# Patient Record
Sex: Female | Born: 1940 | Race: White | Hispanic: No | Marital: Married | Smoking: Never smoker
Health system: Southern US, Community
[De-identification: ages and names within clinical notes are randomized; demographics above are authoritative.]

## PROBLEM LIST (undated history)

## (undated) DIAGNOSIS — F419 Anxiety disorder, unspecified: Secondary | ICD-10-CM

## (undated) DIAGNOSIS — F32A Depression, unspecified: Secondary | ICD-10-CM

## (undated) DIAGNOSIS — E328 Other diseases of thymus: Secondary | ICD-10-CM

## (undated) DIAGNOSIS — F329 Major depressive disorder, single episode, unspecified: Secondary | ICD-10-CM

## (undated) DIAGNOSIS — F39 Unspecified mood [affective] disorder: Secondary | ICD-10-CM

## (undated) DIAGNOSIS — M8580 Other specified disorders of bone density and structure, unspecified site: Secondary | ICD-10-CM

## (undated) DIAGNOSIS — M1990 Unspecified osteoarthritis, unspecified site: Secondary | ICD-10-CM

## (undated) HISTORY — DX: Major depressive disorder, single episode, unspecified: F32.9

## (undated) HISTORY — DX: Other specified disorders of bone density and structure, unspecified site: M85.80

## (undated) HISTORY — DX: Unspecified mood (affective) disorder: F39

## (undated) HISTORY — PX: GANGLION CYST EXCISION: SHX1691

## (undated) HISTORY — DX: Anxiety disorder, unspecified: F41.9

## (undated) HISTORY — DX: Depression, unspecified: F32.A

## (undated) HISTORY — DX: Other diseases of thymus: E32.8

## (undated) HISTORY — PX: TOTAL ABDOMINAL HYSTERECTOMY: SHX209

## (undated) HISTORY — DX: Unspecified osteoarthritis, unspecified site: M19.90

---

## 1999-10-17 ENCOUNTER — Other Ambulatory Visit: Admission: RE | Admit: 1999-10-17 | Discharge: 1999-10-17 | Payer: Self-pay | Admitting: *Deleted

## 2000-10-23 ENCOUNTER — Other Ambulatory Visit: Admission: RE | Admit: 2000-10-23 | Discharge: 2000-10-23 | Payer: Self-pay | Admitting: *Deleted

## 2000-11-01 ENCOUNTER — Ambulatory Visit (HOSPITAL_COMMUNITY): Admission: RE | Admit: 2000-11-01 | Discharge: 2000-11-01 | Payer: Self-pay | Admitting: Internal Medicine

## 2001-11-18 ENCOUNTER — Other Ambulatory Visit: Admission: RE | Admit: 2001-11-18 | Discharge: 2001-11-18 | Payer: Self-pay | Admitting: Obstetrics and Gynecology

## 2001-12-16 ENCOUNTER — Encounter: Admission: RE | Admit: 2001-12-16 | Discharge: 2002-03-16 | Payer: Self-pay | Admitting: Internal Medicine

## 2003-03-08 ENCOUNTER — Other Ambulatory Visit: Admission: RE | Admit: 2003-03-08 | Discharge: 2003-03-08 | Payer: Self-pay | Admitting: Obstetrics and Gynecology

## 2008-10-22 ENCOUNTER — Ambulatory Visit (HOSPITAL_COMMUNITY): Admission: RE | Admit: 2008-10-22 | Discharge: 2008-10-22 | Payer: Self-pay | Admitting: Internal Medicine

## 2009-02-02 ENCOUNTER — Encounter: Admission: RE | Admit: 2009-02-02 | Discharge: 2009-02-02 | Payer: Self-pay | Admitting: *Deleted

## 2009-02-22 ENCOUNTER — Ambulatory Visit: Payer: Self-pay | Admitting: Thoracic Surgery

## 2009-03-07 ENCOUNTER — Ambulatory Visit (HOSPITAL_COMMUNITY): Admission: RE | Admit: 2009-03-07 | Discharge: 2009-03-07 | Payer: Self-pay | Admitting: Thoracic Surgery

## 2009-03-07 ENCOUNTER — Ambulatory Visit: Payer: Self-pay | Admitting: Thoracic Surgery

## 2009-06-14 ENCOUNTER — Ambulatory Visit: Payer: Self-pay | Admitting: Thoracic Surgery

## 2009-06-20 ENCOUNTER — Encounter: Admission: RE | Admit: 2009-06-20 | Discharge: 2009-06-20 | Payer: Self-pay | Admitting: Thoracic Surgery

## 2009-07-04 ENCOUNTER — Inpatient Hospital Stay (HOSPITAL_COMMUNITY): Admission: RE | Admit: 2009-07-04 | Discharge: 2009-07-17 | Payer: Self-pay | Admitting: Thoracic Surgery

## 2009-07-04 ENCOUNTER — Encounter: Payer: Self-pay | Admitting: Thoracic Surgery

## 2009-07-04 ENCOUNTER — Ambulatory Visit: Payer: Self-pay | Admitting: Cardiology

## 2009-07-04 HISTORY — PX: OTHER SURGICAL HISTORY: SHX169

## 2009-07-07 ENCOUNTER — Ambulatory Visit: Payer: Self-pay | Admitting: Thoracic Surgery

## 2009-07-07 ENCOUNTER — Ambulatory Visit: Payer: Self-pay | Admitting: Pulmonary Disease

## 2009-07-08 ENCOUNTER — Encounter: Payer: Self-pay | Admitting: Thoracic Surgery

## 2009-07-20 ENCOUNTER — Encounter: Admission: RE | Admit: 2009-07-20 | Discharge: 2009-07-20 | Payer: Self-pay | Admitting: Thoracic Surgery

## 2009-07-20 ENCOUNTER — Ambulatory Visit: Payer: Self-pay | Admitting: Thoracic Surgery

## 2009-08-03 ENCOUNTER — Encounter: Admission: RE | Admit: 2009-08-03 | Discharge: 2009-08-03 | Payer: Self-pay | Admitting: Thoracic Surgery

## 2009-08-03 ENCOUNTER — Ambulatory Visit: Payer: Self-pay | Admitting: Thoracic Surgery

## 2009-08-31 ENCOUNTER — Ambulatory Visit: Payer: Self-pay | Admitting: Thoracic Surgery

## 2009-08-31 ENCOUNTER — Encounter: Admission: RE | Admit: 2009-08-31 | Discharge: 2009-08-31 | Payer: Self-pay | Admitting: Thoracic Surgery

## 2009-10-19 ENCOUNTER — Ambulatory Visit: Payer: Self-pay | Admitting: Thoracic Surgery

## 2009-10-19 ENCOUNTER — Encounter: Admission: RE | Admit: 2009-10-19 | Discharge: 2009-10-19 | Payer: Self-pay | Admitting: Thoracic Surgery

## 2010-01-24 ENCOUNTER — Encounter: Admission: RE | Admit: 2010-01-24 | Discharge: 2010-01-24 | Payer: Self-pay | Admitting: Thoracic Surgery

## 2010-01-24 ENCOUNTER — Ambulatory Visit: Payer: Self-pay | Admitting: Thoracic Surgery

## 2010-07-05 LAB — BASIC METABOLIC PANEL
BUN: 14 mg/dL (ref 6–23)
CO2: 22 mEq/L (ref 19–32)
CO2: 25 mEq/L (ref 19–32)
Calcium: 8 mg/dL — ABNORMAL LOW (ref 8.4–10.5)
Calcium: 8 mg/dL — ABNORMAL LOW (ref 8.4–10.5)
Calcium: 8.7 mg/dL (ref 8.4–10.5)
Chloride: 105 mEq/L (ref 96–112)
Chloride: 108 mEq/L (ref 96–112)
Creatinine, Ser: 0.73 mg/dL (ref 0.4–1.2)
GFR calc Af Amer: >60 mL/min (ref >60)
GFR calc non Af Amer: >60 mL/min (ref >60)
Glucose, Bld: 303 mg/dL — ABNORMAL HIGH (ref 70–99)
Potassium: 3.5 mEq/L (ref 3.5–5.1)

## 2010-07-05 LAB — GLUCOSE, CAPILLARY
Glucose-Capillary: 135 mg/dL — ABNORMAL HIGH (ref 70–99)
Glucose-Capillary: 158 mg/dL — ABNORMAL HIGH (ref 70–99)
Glucose-Capillary: 187 mg/dL — ABNORMAL HIGH (ref 70–99)
Glucose-Capillary: 218 mg/dL — ABNORMAL HIGH (ref 70–99)
Glucose-Capillary: 225 mg/dL — ABNORMAL HIGH (ref 70–99)
Glucose-Capillary: 236 mg/dL — ABNORMAL HIGH (ref 70–99)
Glucose-Capillary: 330 mg/dL — ABNORMAL HIGH (ref 70–99)
Glucose-Capillary: 330 mg/dL — ABNORMAL HIGH (ref 70–99)
Glucose-Capillary: 331 mg/dL — ABNORMAL HIGH (ref 70–99)

## 2010-07-05 LAB — CBC
HCT: 25.7 % — ABNORMAL LOW (ref 36.0–46.0)
Hemoglobin: 8.5 g/dL — ABNORMAL LOW (ref 12.0–15.0)
Hemoglobin: 9.2 g/dL — ABNORMAL LOW (ref 12.0–15.0)
Platelets: 404 10*3/uL — ABNORMAL HIGH (ref 150–400)
RBC: 3.02 MIL/uL — ABNORMAL LOW (ref 3.87–5.11)
RBC: 3.35 MIL/uL — ABNORMAL LOW (ref 3.87–5.11)
RDW: 14.1 % (ref 11.5–15.5)

## 2010-07-10 LAB — POCT I-STAT 3, ART BLOOD GAS (G3+)
Acid-Base Excess: 4 mmol/L — ABNORMAL HIGH (ref 0.0–2.0)
Acid-Base Excess: 8 mmol/L — ABNORMAL HIGH (ref 0.0–2.0)
Bicarbonate: 29.9 mEq/L — ABNORMAL HIGH (ref 20.0–24.0)
Bicarbonate: 35.6 mEq/L — ABNORMAL HIGH (ref 20.0–24.0)
O2 Saturation: 95 %
O2 Saturation: 95 %
Patient temperature: 98.1
Patient temperature: 99.3
TCO2: 35 mmol/L (ref 0–100)
TCO2: 37 mmol/L (ref 0–100)
pCO2 arterial: 41.1 mmHg (ref 35.0–45.0)
pCO2 arterial: 49.9 mmHg — ABNORMAL HIGH (ref 35.0–45.0)
pCO2 arterial: 56.9 mmHg — ABNORMAL HIGH (ref 35.0–45.0)
pH, Arterial: 7.346 — ABNORMAL LOW (ref 7.350–7.400)
pH, Arterial: 7.405 — ABNORMAL HIGH (ref 7.350–7.400)
pO2, Arterial: 123 mmHg — ABNORMAL HIGH (ref 80.0–100.0)
pO2, Arterial: 408 mmHg — ABNORMAL HIGH (ref 80.0–100.0)
pO2, Arterial: 72 mmHg — ABNORMAL LOW (ref 80.0–100.0)

## 2010-07-10 LAB — GLUCOSE, CAPILLARY
Glucose-Capillary: 100 mg/dL — ABNORMAL HIGH (ref 70–99)
Glucose-Capillary: 108 mg/dL — ABNORMAL HIGH (ref 70–99)
Glucose-Capillary: 111 mg/dL — ABNORMAL HIGH (ref 70–99)
Glucose-Capillary: 113 mg/dL — ABNORMAL HIGH (ref 70–99)
Glucose-Capillary: 116 mg/dL — ABNORMAL HIGH (ref 70–99)
Glucose-Capillary: 119 mg/dL — ABNORMAL HIGH (ref 70–99)
Glucose-Capillary: 121 mg/dL — ABNORMAL HIGH (ref 70–99)
Glucose-Capillary: 123 mg/dL — ABNORMAL HIGH (ref 70–99)
Glucose-Capillary: 134 mg/dL — ABNORMAL HIGH (ref 70–99)
Glucose-Capillary: 136 mg/dL — ABNORMAL HIGH (ref 70–99)
Glucose-Capillary: 140 mg/dL — ABNORMAL HIGH (ref 70–99)
Glucose-Capillary: 140 mg/dL — ABNORMAL HIGH (ref 70–99)
Glucose-Capillary: 140 mg/dL — ABNORMAL HIGH (ref 70–99)
Glucose-Capillary: 142 mg/dL — ABNORMAL HIGH (ref 70–99)
Glucose-Capillary: 148 mg/dL — ABNORMAL HIGH (ref 70–99)
Glucose-Capillary: 148 mg/dL — ABNORMAL HIGH (ref 70–99)
Glucose-Capillary: 149 mg/dL — ABNORMAL HIGH (ref 70–99)
Glucose-Capillary: 149 mg/dL — ABNORMAL HIGH (ref 70–99)
Glucose-Capillary: 158 mg/dL — ABNORMAL HIGH (ref 70–99)
Glucose-Capillary: 158 mg/dL — ABNORMAL HIGH (ref 70–99)
Glucose-Capillary: 162 mg/dL — ABNORMAL HIGH (ref 70–99)
Glucose-Capillary: 163 mg/dL — ABNORMAL HIGH (ref 70–99)
Glucose-Capillary: 167 mg/dL — ABNORMAL HIGH (ref 70–99)
Glucose-Capillary: 168 mg/dL — ABNORMAL HIGH (ref 70–99)
Glucose-Capillary: 170 mg/dL — ABNORMAL HIGH (ref 70–99)
Glucose-Capillary: 176 mg/dL — ABNORMAL HIGH (ref 70–99)
Glucose-Capillary: 178 mg/dL — ABNORMAL HIGH (ref 70–99)
Glucose-Capillary: 184 mg/dL — ABNORMAL HIGH (ref 70–99)
Glucose-Capillary: 186 mg/dL — ABNORMAL HIGH (ref 70–99)
Glucose-Capillary: 193 mg/dL — ABNORMAL HIGH (ref 70–99)
Glucose-Capillary: 193 mg/dL — ABNORMAL HIGH (ref 70–99)
Glucose-Capillary: 194 mg/dL — ABNORMAL HIGH (ref 70–99)
Glucose-Capillary: 214 mg/dL — ABNORMAL HIGH (ref 70–99)
Glucose-Capillary: 216 mg/dL — ABNORMAL HIGH (ref 70–99)
Glucose-Capillary: 228 mg/dL — ABNORMAL HIGH (ref 70–99)
Glucose-Capillary: 240 mg/dL — ABNORMAL HIGH (ref 70–99)
Glucose-Capillary: 242 mg/dL — ABNORMAL HIGH (ref 70–99)
Glucose-Capillary: 242 mg/dL — ABNORMAL HIGH (ref 70–99)
Glucose-Capillary: 90 mg/dL (ref 70–99)
Glucose-Capillary: 96 mg/dL (ref 70–99)
Glucose-Capillary: 98 mg/dL (ref 70–99)

## 2010-07-10 LAB — CULTURE, BLOOD (ROUTINE X 2)

## 2010-07-10 LAB — CBC
HCT: 23.9 % — ABNORMAL LOW (ref 36.0–46.0)
HCT: 25.5 % — ABNORMAL LOW (ref 36.0–46.0)
HCT: 26.9 % — ABNORMAL LOW (ref 36.0–46.0)
HCT: 28.1 % — ABNORMAL LOW (ref 36.0–46.0)
HCT: 31 % — ABNORMAL LOW (ref 36.0–46.0)
Hemoglobin: 10 g/dL — ABNORMAL LOW (ref 12.0–15.0)
Hemoglobin: 7.7 g/dL — ABNORMAL LOW (ref 12.0–15.0)
Hemoglobin: 7.8 g/dL — ABNORMAL LOW (ref 12.0–15.0)
Hemoglobin: 8.1 g/dL — ABNORMAL LOW (ref 12.0–15.0)
Hemoglobin: 8.8 g/dL — ABNORMAL LOW (ref 12.0–15.0)
Hemoglobin: 9.1 g/dL — ABNORMAL LOW (ref 12.0–15.0)
MCHC: 32.8 g/dL (ref 30.0–36.0)
MCHC: 33.4 g/dL (ref 30.0–36.0)
MCHC: 34 g/dL (ref 30.0–36.0)
MCV: 85.3 fL (ref 78.0–100.0)
MCV: 85.8 fL (ref 78.0–100.0)
MCV: 85.9 fL (ref 78.0–100.0)
MCV: 85.9 fL (ref 78.0–100.0)
Platelets: 226 10*3/uL (ref 150–400)
Platelets: 244 10*3/uL (ref 150–400)
Platelets: 253 10*3/uL (ref 150–400)
Platelets: 279 10*3/uL (ref 150–400)
Platelets: 334 10*3/uL (ref 150–400)
RBC: 2.69 MIL/uL — ABNORMAL LOW (ref 3.87–5.11)
RBC: 2.75 MIL/uL — ABNORMAL LOW (ref 3.87–5.11)
RBC: 2.86 MIL/uL — ABNORMAL LOW (ref 3.87–5.11)
RBC: 2.97 MIL/uL — ABNORMAL LOW (ref 3.87–5.11)
RBC: 3.13 MIL/uL — ABNORMAL LOW (ref 3.87–5.11)
RBC: 3.14 MIL/uL — ABNORMAL LOW (ref 3.87–5.11)
RBC: 3.25 MIL/uL — ABNORMAL LOW (ref 3.87–5.11)
RBC: 3.41 MIL/uL — ABNORMAL LOW (ref 3.87–5.11)
RDW: 13.9 % (ref 11.5–15.5)
RDW: 14.3 % (ref 11.5–15.5)
RDW: 14.4 % (ref 11.5–15.5)
RDW: 14.6 % (ref 11.5–15.5)
WBC: 11.1 10*3/uL — ABNORMAL HIGH (ref 4.0–10.5)
WBC: 12.9 10*3/uL — ABNORMAL HIGH (ref 4.0–10.5)
WBC: 14.6 10*3/uL — ABNORMAL HIGH (ref 4.0–10.5)
WBC: 14.6 10*3/uL — ABNORMAL HIGH (ref 4.0–10.5)
WBC: 16.2 10*3/uL — ABNORMAL HIGH (ref 4.0–10.5)
WBC: 5.8 10*3/uL (ref 4.0–10.5)
WBC: 8.9 10*3/uL (ref 4.0–10.5)
WBC: 9.8 10*3/uL (ref 4.0–10.5)

## 2010-07-10 LAB — RETICULOCYTES
RBC.: 2.86 MIL/uL — ABNORMAL LOW (ref 3.87–5.11)
Retic Count, Absolute: 65.8 10*3/uL (ref 19.0–186.0)
Retic Ct Pct: 2.3 % (ref 0.4–3.1)

## 2010-07-10 LAB — TYPE AND SCREEN
ABO/RH(D): A POS
Antibody Screen: NEGATIVE

## 2010-07-10 LAB — BLOOD GAS, ARTERIAL
Acid-Base Excess: 4.2 mmol/L — ABNORMAL HIGH (ref 0.0–2.0)
Acid-Base Excess: 5.1 mmol/L — ABNORMAL HIGH (ref 0.0–2.0)
Bicarbonate: 29.3 mEq/L — ABNORMAL HIGH (ref 20.0–24.0)
Bicarbonate: 33.4 mEq/L — ABNORMAL HIGH (ref 20.0–24.0)
Drawn by: 206361
FIO2: 0.3 %
MECHVT: 440 mL
MECHVT: 440 mL
O2 Content: 2 L/min
O2 Saturation: 98.1 %
Patient temperature: 97.9
Patient temperature: 98.6
Patient temperature: 98.6
RATE: 15 resp/min
TCO2: 30.6 mmol/L (ref 0–100)
pCO2 arterial: 53.6 mmHg — ABNORMAL HIGH (ref 35.0–45.0)
pH, Arterial: 7.435 — ABNORMAL HIGH (ref 7.350–7.400)
pH, Arterial: 7.437 — ABNORMAL HIGH (ref 7.350–7.400)
pH, Arterial: 7.463 — ABNORMAL HIGH (ref 7.350–7.400)
pH, Arterial: 7.478 — ABNORMAL HIGH (ref 7.350–7.400)

## 2010-07-10 LAB — COMPREHENSIVE METABOLIC PANEL
ALT: 20 U/L (ref 0–35)
ALT: 30 U/L (ref 0–35)
AST: 24 U/L (ref 0–37)
AST: 31 U/L (ref 0–37)
AST: 36 U/L (ref 0–37)
Albumin: 2.6 g/dL — ABNORMAL LOW (ref 3.5–5.2)
Alkaline Phosphatase: 50 U/L (ref 39–117)
Alkaline Phosphatase: 54 U/L (ref 39–117)
Alkaline Phosphatase: 58 U/L (ref 39–117)
CO2: 25 mEq/L (ref 19–32)
CO2: 25 mEq/L (ref 19–32)
CO2: 33 mEq/L — ABNORMAL HIGH (ref 19–32)
Chloride: 102 mEq/L (ref 96–112)
Chloride: 106 mEq/L (ref 96–112)
Chloride: 99 mEq/L (ref 96–112)
GFR calc Af Amer: >60 mL/min (ref >60)
GFR calc Af Amer: >60 mL/min (ref >60)
GFR calc non Af Amer: >60 mL/min (ref >60)
GFR calc non Af Amer: >60 mL/min (ref >60)
GFR calc non Af Amer: >60 mL/min (ref >60)
Glucose, Bld: 174 mg/dL — ABNORMAL HIGH (ref 70–99)
Glucose, Bld: 88 mg/dL (ref 70–99)
Potassium: 3.5 mEq/L (ref 3.5–5.1)
Potassium: 4.5 mEq/L (ref 3.5–5.1)
Sodium: 136 mEq/L (ref 135–145)
Sodium: 138 mEq/L (ref 135–145)
Total Bilirubin: 0.6 mg/dL (ref 0.3–1.2)
Total Bilirubin: 0.7 mg/dL (ref 0.3–1.2)

## 2010-07-10 LAB — BASIC METABOLIC PANEL
BUN: 19 mg/dL (ref 6–23)
BUN: 19 mg/dL (ref 6–23)
BUN: 22 mg/dL (ref 6–23)
BUN: 28 mg/dL — ABNORMAL HIGH (ref 6–23)
CO2: 25 mEq/L (ref 19–32)
CO2: 26 mEq/L (ref 19–32)
Calcium: 7.6 mg/dL — ABNORMAL LOW (ref 8.4–10.5)
Calcium: 7.8 mg/dL — ABNORMAL LOW (ref 8.4–10.5)
Calcium: 7.9 mg/dL — ABNORMAL LOW (ref 8.4–10.5)
Calcium: 8.9 mg/dL (ref 8.4–10.5)
Chloride: 101 mEq/L (ref 96–112)
Chloride: 102 mEq/L (ref 96–112)
Chloride: 107 mEq/L (ref 96–112)
Creatinine, Ser: 0.69 mg/dL (ref 0.4–1.2)
Creatinine, Ser: 0.7 mg/dL (ref 0.4–1.2)
Creatinine, Ser: 0.7 mg/dL (ref 0.4–1.2)
Creatinine, Ser: 0.74 mg/dL (ref 0.4–1.2)
Creatinine, Ser: 0.75 mg/dL (ref 0.4–1.2)
Creatinine, Ser: 0.75 mg/dL (ref 0.4–1.2)
GFR calc Af Amer: >60 mL/min (ref >60)
GFR calc Af Amer: >60 mL/min (ref >60)
GFR calc Af Amer: >60 mL/min (ref >60)
GFR calc Af Amer: >60 mL/min (ref >60)
GFR calc Af Amer: >60 mL/min (ref >60)
GFR calc non Af Amer: >60 mL/min (ref >60)
GFR calc non Af Amer: >60 mL/min (ref >60)
GFR calc non Af Amer: >60 mL/min (ref >60)
GFR calc non Af Amer: >60 mL/min (ref >60)
GFR calc non Af Amer: >60 mL/min (ref >60)
GFR calc non Af Amer: >60 mL/min (ref >60)
GFR calc non Af Amer: >60 mL/min (ref >60)
Glucose, Bld: 149 mg/dL — ABNORMAL HIGH (ref 70–99)
Glucose, Bld: 165 mg/dL — ABNORMAL HIGH (ref 70–99)
Glucose, Bld: 230 mg/dL — ABNORMAL HIGH (ref 70–99)
Glucose, Bld: 265 mg/dL — ABNORMAL HIGH (ref 70–99)
Glucose, Bld: 88 mg/dL (ref 70–99)
Potassium: 3 mEq/L — ABNORMAL LOW (ref 3.5–5.1)
Potassium: 3.5 mEq/L (ref 3.5–5.1)
Potassium: 4.4 mEq/L (ref 3.5–5.1)
Sodium: 131 mEq/L — ABNORMAL LOW (ref 135–145)
Sodium: 135 mEq/L (ref 135–145)
Sodium: 141 mEq/L (ref 135–145)

## 2010-07-10 LAB — BRAIN NATRIURETIC PEPTIDE: Pro B Natriuretic peptide (BNP): 501 pg/mL — ABNORMAL HIGH (ref 0.0–100.0)

## 2010-07-10 LAB — PROTIME-INR: Prothrombin Time: 13.1 seconds (ref 11.6–15.2)

## 2010-07-10 LAB — BODY FLUID CULTURE: Culture: NO GROWTH

## 2010-07-10 LAB — CARDIAC PANEL(CRET KIN+CKTOT+MB+TROPI)
CK, MB: 1.9 ng/mL (ref 0.3–4.0)
Relative Index: INVALID (ref 0.0–2.5)
Total CK: 46 U/L (ref 7–177)

## 2010-07-10 LAB — PHOSPHORUS: Phosphorus: 3.3 mg/dL (ref 2.3–4.6)

## 2010-07-10 LAB — URINALYSIS, ROUTINE W REFLEX MICROSCOPIC
Glucose, UA: NEGATIVE mg/dL
Hgb urine dipstick: NEGATIVE
Ketones, ur: NEGATIVE mg/dL
Protein, ur: NEGATIVE mg/dL
pH: 7.5 (ref 5.0–8.0)

## 2010-07-10 LAB — FOLATE: Folate: 13.5 ng/mL

## 2010-07-10 LAB — IRON AND TIBC
Iron: <10 ug/dL — ABNORMAL LOW (ref 42–135)
UIBC: 185 ug/dL

## 2010-07-10 LAB — CULTURE, BAL-QUANTITATIVE W GRAM STAIN: Colony Count: NO GROWTH

## 2010-07-10 LAB — POCT I-STAT GLUCOSE: Glucose, Bld: 172 mg/dL — ABNORMAL HIGH (ref 70–99)

## 2010-07-10 LAB — MAGNESIUM
Magnesium: 2 mg/dL (ref 1.5–2.5)
Magnesium: 2 mg/dL (ref 1.5–2.5)

## 2010-07-10 LAB — FERRITIN: Ferritin: 91 ng/mL (ref 10–291)

## 2010-07-10 LAB — VANCOMYCIN, TROUGH: Vancomycin Tr: 11.3 ug/mL (ref 10.0–20.0)

## 2010-07-10 LAB — VITAMIN B12: Vitamin B-12: 1998 pg/mL — ABNORMAL HIGH (ref 211–911)

## 2010-07-10 LAB — MRSA PCR SCREENING: MRSA by PCR: NEGATIVE

## 2010-07-10 LAB — URINE MICROSCOPIC-ADD ON

## 2010-07-17 ENCOUNTER — Other Ambulatory Visit: Payer: Self-pay | Admitting: Thoracic Surgery

## 2010-07-17 DIAGNOSIS — R222 Localized swelling, mass and lump, trunk: Secondary | ICD-10-CM

## 2010-07-18 ENCOUNTER — Ambulatory Visit
Admission: RE | Admit: 2010-07-18 | Discharge: 2010-07-18 | Disposition: A | Discharge status: Home / Self Care | Payer: 59 | Source: Ambulatory Visit | Attending: Thoracic Surgery | Admitting: Thoracic Surgery

## 2010-07-18 ENCOUNTER — Ambulatory Visit: Payer: Self-pay | Admitting: Thoracic Surgery

## 2010-07-18 ENCOUNTER — Ambulatory Visit (INDEPENDENT_AMBULATORY_CARE_PROVIDER_SITE_OTHER): Payer: 59 | Admitting: Thoracic Surgery

## 2010-07-18 DIAGNOSIS — E328 Other diseases of thymus: Secondary | ICD-10-CM

## 2010-07-18 DIAGNOSIS — R222 Localized swelling, mass and lump, trunk: Secondary | ICD-10-CM

## 2010-07-19 NOTE — Assessment & Plan Note (Unsigned)
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 Edwyna Shell on July 04, 2009.  She was last seen by Dr. Edwyna Shell on October 2011.  The patient presents today for a followup visit of 6 months with repeat chest x-ray.  Postoperatively, the patient had an elevated left hemidiaphragm.  This has been followed following surgery. The patient states today she continues to progress well.  She is doing yoga and aerobics.  She denies any pain.  She denies any shortness of breath, coughing, fevers, nausea, or vomiting.  PHYSICAL EXAMINATION:  Vital Signs:  Blood pressure 134/79, pulse of 56, respirations of 16, O2 sats 98% on room air.  Respiratory:  Clear to auscultation bilaterally.  Cardiac:  Regular rate and rhythm.  Chest: Incisions healed well.  Sternum noted to be stable.  LABORATORY DATA:  The patient had PA and lateral chest x-ray obtained today which showed her to be stable with no evidence of recurrent mediastinal mass or acute process.  Per report noted stable elevation of the left hemidiaphragm.  Slight improvement on evaluation of x-ray.  IMPRESSION AND PLAN:  The patient was seen and evaluated by Dr. Edwyna Shell. Dr. Edwyna Shell discussed the patient's chest x-ray with her.  Her x-ray does show slight improvement of the elevation in the left hemidiaphragm.  At this point we will follow back up with the patient in 6 months with the CT scan of the chest.  The patient is told if she has any surgical issues in the interim she is to contact us.  We will see her sooner. The patient is in agreement.  Sol Blazing, PA  KMD/MEDQ  D:  07/18/2010  T:  07/19/2010  Job:  [id number]

## 2010-08-29 NOTE — Letter (Signed)
March 07, 2009   Gwen Pounds, MD  [address]  [place], [place] [address]   Re:  Kelly Baird, Kelly Baird               DOB:  1941/03/09   Dear Dr. Timothy Lasso:   I saw the patient back today.  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  There was a calcified mass in the left  upper quadrant, which was thought to be a calcified cyst.  Her blood  pressure was 149/85, pulse 73, respirations 18, and sats were 99%.  Again, given the size of this, I have recommended that she have this  resected with a partial median sternotomy.  She will decide when she  wants to have this done and we will probably schedule it after the first  of the year.  I answered all the questions of her and her husband  regarding the operation as well as the reasons for the resection.  Hopefully, this will help her chronic cough.   Sincerely,   Ines Bloomer, M.D.  Electronically Signed   DPB/MEDQ  D:  03/07/2009  T:  03/08/2009  Job:  [id number]

## 2010-08-29 NOTE — Letter (Signed)
October 19, 2009   Gwen Pounds, MD  [address]  [place], [place] [address]   Re:  Kelly, Baird               DOB:  1941/01/15   Dear Dr. Timothy Lasso:   The patient came back today.  She is going back to work.  Lifting is not  bothering her.  Her left diaphragm is still elevated, has not changed  one way or the other.  It is probably of about 2 cm more than it was  preoperatively, but has definitely improved since immediately  postoperatively.  I am hoping that it will gradually improve over the  next 3-9 months.  I plan to see her back again in 3 months with a chest  x-ray.  Her blood pressure was 130/90, pulse 61, respirations 18, and  sats were 95%.   Ines Bloomer, M.D.  Electronically Signed   DPB/MEDQ  D:  10/19/2009  T:  10/20/2009  Job:  [id number]

## 2010-08-29 NOTE — Letter (Signed)
June 14, 2009   Gwen Pounds, MD  [address]  [place]  [place] [address].   Re:  AYLINE, DINGUS               DOB:  09/22/1940   Dear Dr. Timothy Lasso:   I saw this patient back today and after going to the [facility]  for a second opinio, she has decided to have her mediastinal cyst  excised.  We planned to do this on July 04, 2009 at [facility].  We  discussed both the options of thoracoscopy versus partial median  sternotomy, and I think she has decided that a partial median sternotomy  would be the best way to go, at least that is my recommendation as it  gives the best chance of total excision of her cyst.  Prior to surgery  though, I want to repeat her CT scan to be sure there is no major change  since her last CT scan 3 months ago.  She also wants to auto donate her  blood, so we have arranged for that.  One other problems since she is on  insulin pump, we will have to discontinue that and put her on  Glucommander during and after her surgery.  I will give you a call to  maybe let us help with diabetes after her surgery.  I appreciate the  opportunity of seeing the patient.   Ines Bloomer, M.D.  Electronically Signed   DPB/MEDQ  D:  06/14/2009  T:  06/15/2009  Job:  [id number]

## 2010-08-29 NOTE — Letter (Signed)
February 22, 2009   Gwen Pounds, MD  [address]  [place], [place] [address]   Re:  CLAUDEEN, LEASON               DOB:  20-May-1940   Dear Dr. Timothy Lasso:   I appreciate the opportunity of seeing the patient.  This 70 year old  Caucasian female presents because of a mediastinal mass.  She apparently  had a chest x-ray done in 2008 and a repeat chest x-ray in 2010 that  showed enlargement in her mediastinum.  A CT scan revealed a probable  large thymic cyst with 11 x 11 x 7 cm and an MRI confirmed this.  There  also is some mild mediastinal adenopathy on both CT scan and MRI.  She  has had a chronic cough that initiated all this.  No other shortness of  breath or no other symptoms of pressure.   She has diabetes mellitus type 1.   MEDICATIONS:  NovoLog pump, Prozac 30 mg a day, aspirin, multivitamins,  zinc, Hyzaar 100/12.5 daily, Wellbutrin 300 mg daily, BuSpar 10 mg twice  a day, , hydrochlorothiazide 25 mg a day.  She has been on __________  500 mg daily, and calcium.   She has no allergies.   FAMILY HISTORY:  Positive for cardiac disease.   SOCIAL HISTORY:  She is married, has 2 children.  Works for [employer]  [employer].  Does not drink alcohol on a regular basis.   REVIEW OF SYSTEMS:  VITAL SIGNS:  She is 147 pounds.  She is 5 feet 8  inches.  GENERAL:  Her weight has been stable.  CARDIAC:  No angina or atrial fibrillation.  PULMONARY:  She has got a chronic cough.  No hemoptysis or asthma.  GI:  She has got no nausea, vomiting, constipation, or diarrhea.  GU:  No kidney disease, dysuria, or frequent urination.  VASCULAR:  No claudication, DVT, or TIAs.  NEUROLOGICAL:  No dizziness, headaches, blackouts, or seizures.  MUSCULOSKELETAL:  No arthritis.  PSYCHIATRIC:  She has been treated for depression.  EYE/ENT:  No changes in eyesight or hearing.  HEMATOLOGICAL:  No problems with bleeding, clotting disorders, or  anemia.   PHYSICAL EXAMINATION:  General:   She is a well-developed Caucasian  female in no acute distress.  Vital Signs:  Her blood pressure was  149/85, pulse 71, respirations 18, and saturations were 96%.  Head,  Eyes, Ears, Nose, and Throat:  Unremarkable.  Neck:  Supple without  thyromegaly.  There is no supraclavicular or axillary adenopathy.  Chest:  Clear to auscultation and percussion.  Heart:  Regular sinus  rhythm.  No murmurs.  Abdomen:  Soft.  No hepatosplenomegaly.  Extremities:  Pulses are 2+.  There is no clubbing or edema.  Neurological:  She is oriented x3.  Sensory and motor intact.  Cranial  nerves intact.   This is a very large complex cyst.  However, there is a low chance of  malignancy that needs to be ruled out.  I have discussed this in great  deal with her and her husband, and I have recommended she have a partial  sternotomy and resection of this.  I think she has some concerns about  proceeding with surgery, and I am also somewhat concerned about her  mediastinal adenopathy.  Because of the possibility that there may be  some occult cancer here, I have recommended she get a PET scan.  Another  reason for this,  is that it has definitely increased in size by chest x-  ray over the last 2 years.  She will think about my recommendation and  see me back after having a PET scan.   Sincerely,   Ines Bloomer, M.D.  Electronically Signed   DPB/MEDQ  D:  02/22/2009  T:  02/23/2009  Job:  [id number]

## 2010-08-29 NOTE — Letter (Signed)
July 20, 2009   Gwen Pounds, MD  [address]  [place], [place] [address]   Re:  MAKENLEE, MCKEAG               DOB:  [birth year]   Dear Dr. Timothy Lasso:   I appreciate the opportunity of seeing the patient.  She came back today  after being discharged from the hospital.  Her incisions are well  healed.  Her blood pressure is 126/67, pulse 58, respirations 18, sats  were 98%.  I appreciate your help with her while she was in the  hospital.  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  Today, her chest x-ray is completely  clear with minimal atelectasis.  There still is elevation in the left  diaphragm but not as much as that it has been, some slight distention in  the stomach.  She says she is eating fine but slowly.  She has stopped  her Reglan, and I told her she could stay off the Reglan for right now.  We also restarted her hydrochlorothiazide and put her potassium  supplements since her potassium was markedly down while in the hospital.  I told that she gradually increase her activity, start driving in about  10 days.  Overall though she is making good progress, I plan to see her  back again in 2 weeks with another chest x-ray.   Ines Bloomer, M.D.  Electronically Signed   DPB/MEDQ  D:  07/20/2009  T:  07/21/2009  Job:  [id number]

## 2010-08-29 NOTE — Letter (Signed)
January 24, 2010   Gwen Pounds, MD  [address]  [place], [place] [address]   Re:  ADAYAH, AROCHO               DOB:  03/25/1941   Dear Dr. Timothy Lasso:   The patient came today and she is doing remarkably well.  She is back to  full activities including aerobics.  Her incision is well healed.  Her  sternum is stable.  Unfortunately, her left diaphragm really has not  changed, so I think this is going to probably be a chronic problem, and  I explained to her that if there is change, we would not see it for now  for probably another 6 months, so I will see her back in 6 months with  another chest x-ray.  Her blood pressure was 143/84, pulse 58,  respirations 18, and sats were 98%.   Ines Bloomer, M.D.  Electronically Signed   DPB/MEDQ  D:  01/24/2010  T:  01/25/2010  Job:  [id number]

## 2010-08-29 NOTE — Letter (Signed)
August 03, 2009   Gwen Pounds, MD  [address]  [place], [place] [address]   Re:  WILLENA, JEANCHARLES               DOB:  05-Sep-1940   Dear Dr. Timothy Lasso:   I saw the patient back today.  Her incision is well healed.  Her chest x-  ray is unchanged, still has the elevated left diaphragm but is not any  worse and maybe slightly better than last time.  She is doing well  overall and gets 70 and 50 of incentive spirometer.  I told her to  gradually increase her activities.  We will see her back again in 4  weeks with chest x-ray.  Her blood pressure is 138/83, pulse 71,  respirations 16, sats were 99%.  She will decide at that time regarding  returning to work.   Ines Bloomer, M.D.  Electronically Signed   DPB/MEDQ  D:  08/03/2009  T:  08/04/2009  Job:  [id number]

## 2010-08-29 NOTE — Letter (Signed)
Aug 31, 2009   Gwen Pounds, MD  [address]  [place], [place] [address]   Re:  SHIAH, BERHOW               DOB:  Apr 15, 1941   Dear Jonny Ruiz,   I saw the patient in the office today and she continues to do better.  She is still somewhat short of breath with decrease in exercise.  Chest  XRAY shows chronic elevation of left hemidiaphragm, may be a little  improved from what it was 4 weeks ago.  I explained to her today that  this may take a long time before this recovers and may not recover at  all.  She will return to work on September 14, 2009, for [employer] and  will do 4 hours a day for 3 weeks and then gradually have her increase  her activities back to full time.  She works on [employer].  I stopped her  potassium and her iron and told her to follow up with you as far as  regular medical problems.  Her blood pressure is 136/86, respirations  18, pulse 59, and sats were 98%.   Ines Bloomer, M.D.  Electronically Signed   DPB/MEDQ  D:  08/31/2009  T:  09/01/2009  Job:  [id number]

## 2010-09-01 NOTE — Procedures (Signed)
[facility]  Patient:    Kelly Baird, Kelly Baird                      MRN: [id number] Adm. Date:  [id number] Attending:  Mervin Hack CC:         Jonelle Sports. Cheryll Cockayne, M.D.   Procedure Report  PROCEDURE:  Colonoscopy.  INDICATION:  This 70 year old white female diabetic has no specific GI symptoms.  She is undergoing screening colonoscopy because of the age of 62. She has had stool cards in the past, which were negative.  She has been on diabetic high-fiber diet.  Her weight has been stable.  There is no family history of colon cancer.  ENDOSCOPE:  Olympus single-channel video scope.  SEDATION:  Versed 8 mg IV, Demerol 75 mg IV.  FINDINGS:  Olympus single-channel video endoscope passed under direct vision through the rectum to the sigmoid colon.  The patient was monitored by pulse oximeter.  Oxygen saturations were normal, but her heart rate slowed down during the procedure, at one point to 32 beats per minute, but responded to deflation of the colon and retracting the colonoscope.  Rectal canal and rectal ampulla were unremarkable.  Sigmoid colon was very tortuous and long, as was the rest of the colon.  Initially the adult-size colonoscope was used, but we switched to a pediatric-size scope in midst of the procedure in order to negotiate a long, tortuous colon.  The splenic flexure, transverse colon, hepatic flexure mucosa was unremarkable.  There were no diverticula.  The right colon was normal all the way to the cecum.  The cecal patch and ileocecal valve were normal.  There were no polyps.  Colonoscope was then slowly retracted following decompression.  Patient tolerated the procedure well.  IMPRESSION:  Normal colonoscopy to the cecum.  PLAN:  Because of her age and absence of the family history of colon cancer, I would suggest repeat colonoscopy in 10 years. DD:  11/01/00 TD:  11/01/00 Job: [id number]

## 2011-01-05 ENCOUNTER — Other Ambulatory Visit: Payer: Self-pay | Admitting: Thoracic Surgery

## 2011-01-05 DIAGNOSIS — D381 Neoplasm of uncertain behavior of trachea, bronchus and lung: Secondary | ICD-10-CM

## 2011-02-06 ENCOUNTER — Encounter: Payer: Self-pay | Admitting: Thoracic Surgery

## 2011-02-06 DIAGNOSIS — E119 Type 2 diabetes mellitus without complications: Secondary | ICD-10-CM | POA: Insufficient documentation

## 2011-02-08 ENCOUNTER — Ambulatory Visit: Payer: 59 | Admitting: Thoracic Surgery

## 2011-02-08 ENCOUNTER — Other Ambulatory Visit: Payer: 59

## 2011-02-09 DIAGNOSIS — E328 Other diseases of thymus: Secondary | ICD-10-CM | POA: Insufficient documentation

## 2011-02-12 ENCOUNTER — Ambulatory Visit (INDEPENDENT_AMBULATORY_CARE_PROVIDER_SITE_OTHER): Payer: 59 | Admitting: Thoracic Surgery

## 2011-02-12 ENCOUNTER — Encounter: Payer: Self-pay | Admitting: Thoracic Surgery

## 2011-02-12 ENCOUNTER — Ambulatory Visit
Admission: RE | Admit: 2011-02-12 | Discharge: 2011-02-12 | Disposition: A | Discharge status: Home / Self Care | Payer: 59 | Source: Ambulatory Visit | Attending: Thoracic Surgery | Admitting: Thoracic Surgery

## 2011-02-12 VITALS — BP 155/81 | HR 61 | Resp 18 | Ht 68.0 in | Wt 142.0 lb

## 2011-02-12 DIAGNOSIS — D381 Neoplasm of uncertain behavior of trachea, bronchus and lung: Secondary | ICD-10-CM

## 2011-02-12 DIAGNOSIS — Z09 Encounter for follow-up examination after completed treatment for conditions other than malignant neoplasm: Secondary | ICD-10-CM

## 2011-02-12 DIAGNOSIS — E328 Other diseases of thymus: Secondary | ICD-10-CM

## 2011-02-12 NOTE — Progress Notes (Signed)
HPI patient returns for followup today  CT scan shows no evidence of recurrence of her enormous thymic cyst. There still was elevation of the left diaphragm secondary to phrenic nerve dysfunction. She is exercising well and has no major problems. I will refer her back to her primary care physician to to follow her. I recommended she get a chest x-ray at least once a year.   Current Outpatient Prescriptions  Medication Sig Dispense Refill  . aspirin 325 MG EC tablet Take 325 mg by mouth daily.        Marland Kitchen b complex vitamins tablet Take 1 tablet by mouth daily.        Marland Kitchen buPROPion (WELLBUTRIN XL) 300 MG 24 hr tablet Take 300 mg by mouth daily.        . busPIRone (BUSPAR) 10 MG tablet Take 10 mg by mouth 2 (two) times daily before a meal.       . calcium carbonate (OS-CAL) 600 MG TABS Take 600 mg by mouth 2 (two) times daily with a meal.        . FLUoxetine (PROZAC) 10 MG capsule Take 10 mg by mouth daily. 30 mg total per day       . hydrochlorothiazide (HYDRODIURIL) 25 MG tablet Take 25 mg by mouth daily.        . insulin aspart (NOVOLOG) 100 UNIT/ML injection Inject into the skin 3 (three) times daily before meals.       . magnesium gluconate (MAGONATE) 500 MG tablet Take 250 mg by mouth 1 day or 1 dose.        . Multiple Vitamin (MULTIVITAMIN) capsule Take 1 capsule by mouth daily.        Marland Kitchen zinc gluconate 50 MG tablet Take 50 mg by mouth daily.           Review of Systems: unchanged   Physical Exam  Cardiovascular: Normal rate, regular rhythm and normal heart sounds.   Pulmonary/Chest: Effort normal and breath sounds normal. No respiratory distress.     Diagnostic Tests: CT scan of the chest shows no recurrence of a thymic cyst and elevation of her left diaphragm.   Impression: Status post resection of thymic cyst   Plan:return as needed

## 2012-02-02 IMAGING — CR DG CHEST 2V
2 series · 2 of 2 positions shown · non-contrast
Comparison: 07/17/2009

CLINICAL DATA: Status post thymic cyst excision.

CHEST - 2 VIEW

[w chest pa]
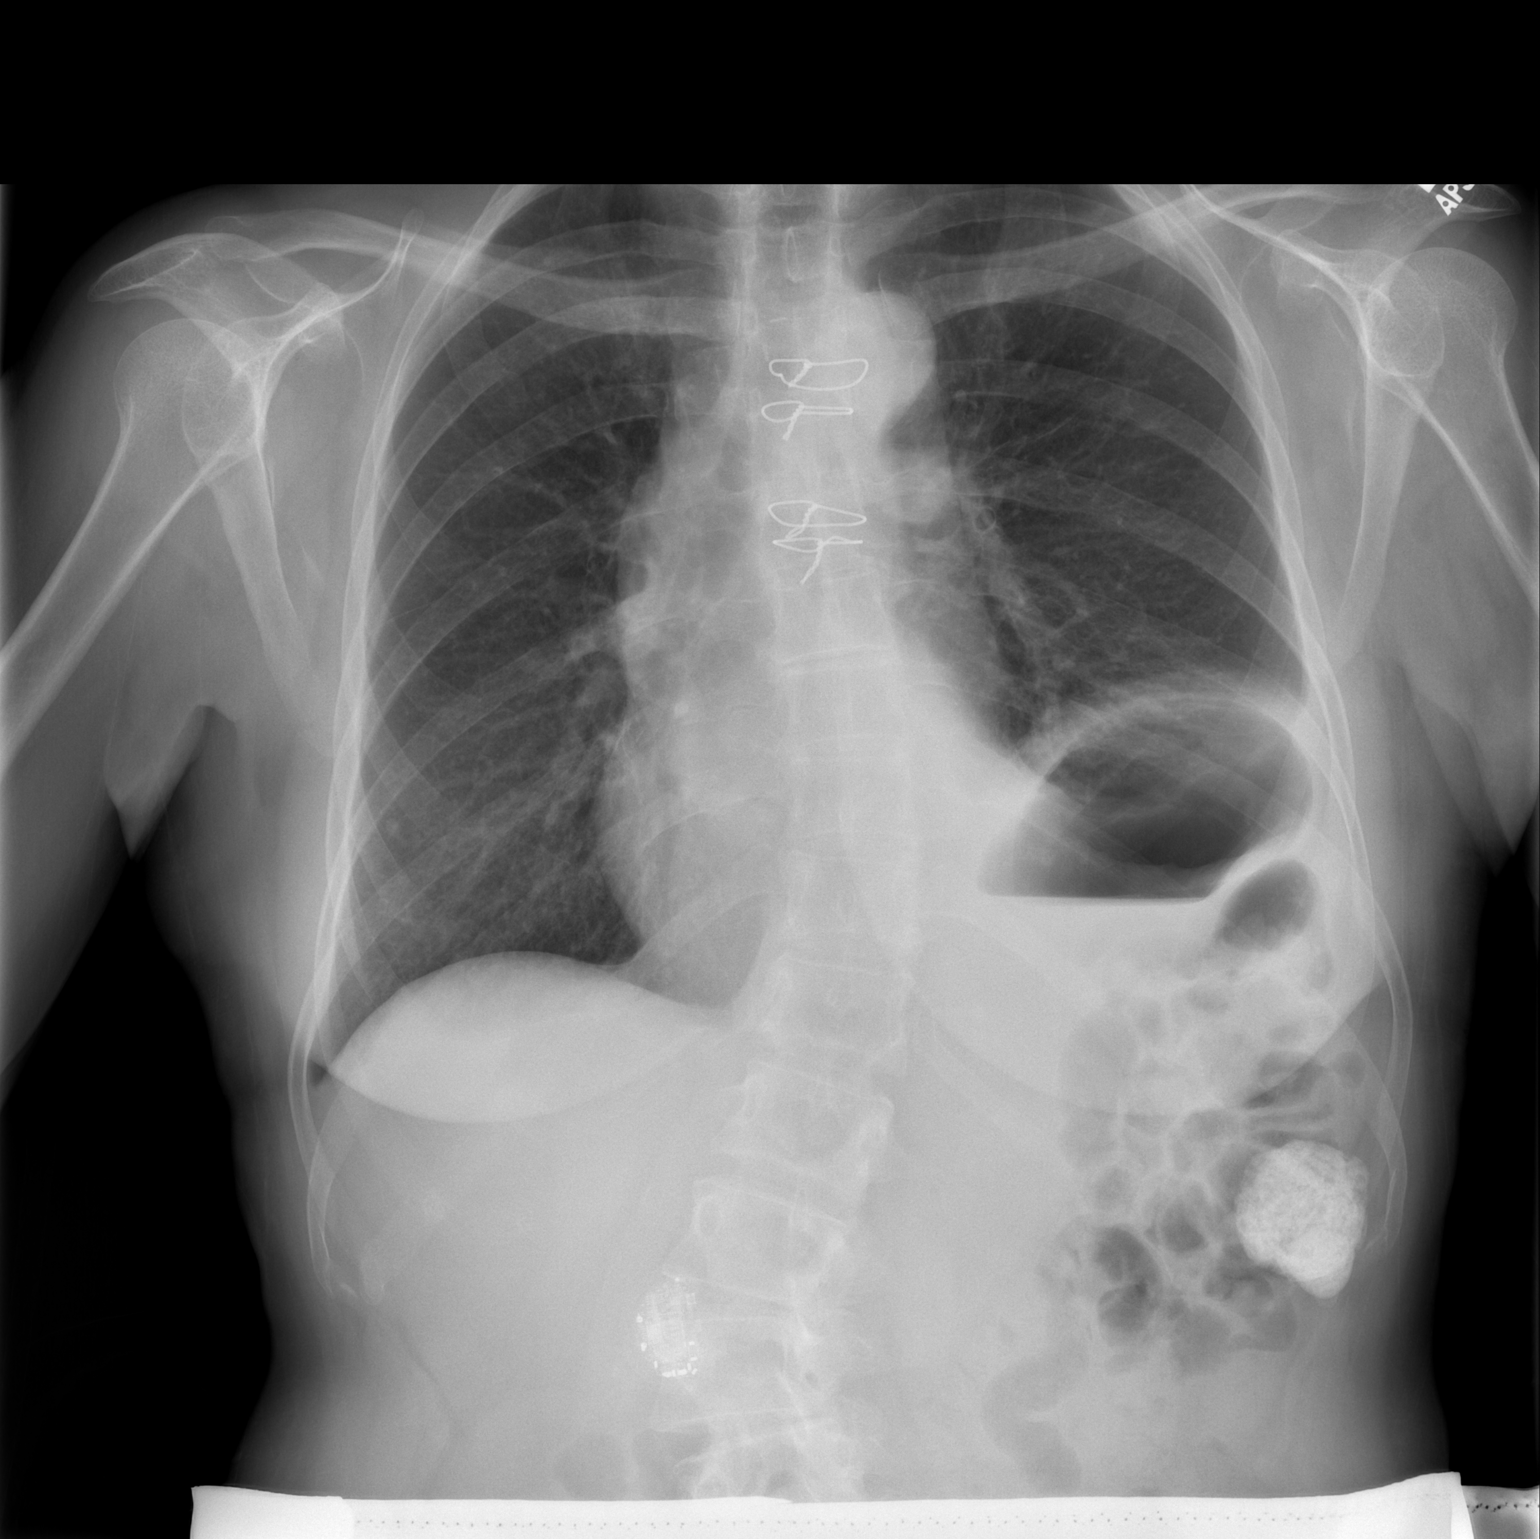

[w chest lat]
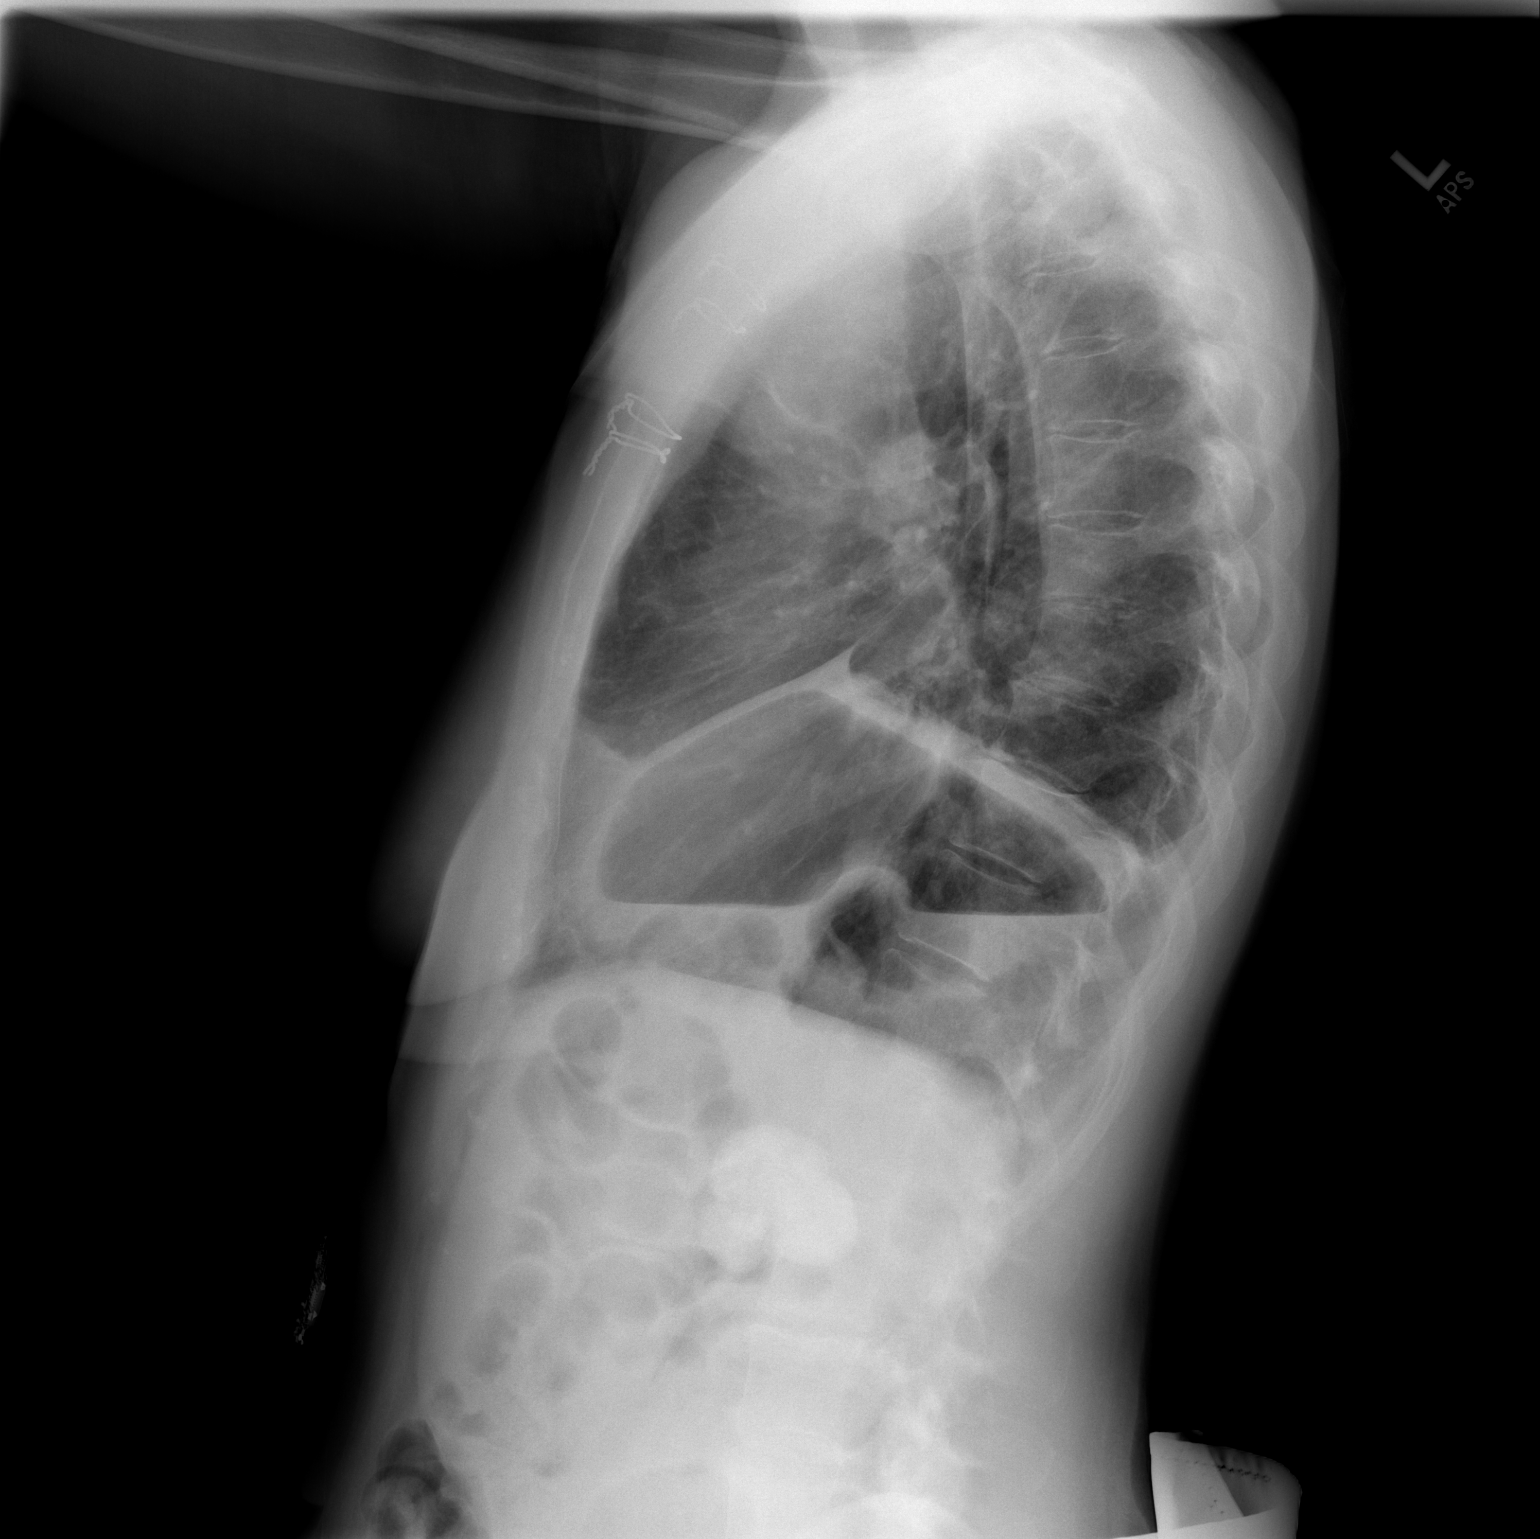

[2 of 2 positions shown; findings below may reference images not displayed]

FINDINGS: There are stable surgical changes median sternotomy
wires.  There is stable elevation of the left hemidiaphragm with
overlying atelectasis.  There is a very small left pleural
effusion.  No infiltrates or edema.  Stable small pulmonary nodule
noted at the right lung base.  Stable calcified density in the left
upper quadrant.
IMPRESSION: 1.  Stable postoperative changes.
2.  Stable elevation of the left hemidiaphragm with overlying small
pleural effusion and resolving left lower lobe atelectasis.
3.  Stable small right lower lobe pulmonary nodule.

## 2012-10-13 ENCOUNTER — Other Ambulatory Visit: Payer: Self-pay | Admitting: Dermatology

## 2013-09-24 ENCOUNTER — Other Ambulatory Visit: Payer: Self-pay | Admitting: Internal Medicine

## 2013-09-24 DIAGNOSIS — Z1231 Encounter for screening mammogram for malignant neoplasm of breast: Secondary | ICD-10-CM

## 2013-10-13 ENCOUNTER — Other Ambulatory Visit: Payer: Self-pay | Admitting: Dermatology

## 2013-11-10 ENCOUNTER — Encounter (INDEPENDENT_AMBULATORY_CARE_PROVIDER_SITE_OTHER): Payer: Self-pay

## 2013-11-10 ENCOUNTER — Ambulatory Visit
Admission: RE | Admit: 2013-11-10 | Discharge: 2013-11-10 | Disposition: A | Discharge status: Home / Self Care | Payer: Medicare Other | Source: Ambulatory Visit | Attending: Internal Medicine | Admitting: Internal Medicine

## 2013-11-10 DIAGNOSIS — Z1231 Encounter for screening mammogram for malignant neoplasm of breast: Secondary | ICD-10-CM

## 2013-11-13 ENCOUNTER — Encounter: Payer: 59 | Admitting: Internal Medicine

## 2015-01-10 ENCOUNTER — Other Ambulatory Visit: Payer: Self-pay

## 2015-01-10 DIAGNOSIS — Z1231 Encounter for screening mammogram for malignant neoplasm of breast: Secondary | ICD-10-CM

## 2015-02-10 ENCOUNTER — Ambulatory Visit
Admission: RE | Admit: 2015-02-10 | Discharge: 2015-02-10 | Disposition: A | Discharge status: Home / Self Care | Payer: Medicare Other | Source: Ambulatory Visit

## 2015-02-10 DIAGNOSIS — Z1231 Encounter for screening mammogram for malignant neoplasm of breast: Secondary | ICD-10-CM

## 2016-07-27 ENCOUNTER — Other Ambulatory Visit: Payer: Self-pay | Admitting: Orthopedic Surgery

## 2016-09-13 ENCOUNTER — Ambulatory Visit (INDEPENDENT_AMBULATORY_CARE_PROVIDER_SITE_OTHER): Payer: Medicare Other | Admitting: Neurology

## 2016-09-13 ENCOUNTER — Encounter (INDEPENDENT_AMBULATORY_CARE_PROVIDER_SITE_OTHER): Payer: Self-pay

## 2016-09-13 ENCOUNTER — Encounter: Payer: Self-pay | Admitting: Neurology

## 2016-09-13 VITALS — BP 124/60 | HR 70 | Resp 14 | Ht 68.0 in | Wt 142.0 lb

## 2016-09-13 DIAGNOSIS — F518 Other sleep disorders not due to a substance or known physiological condition: Secondary | ICD-10-CM | POA: Diagnosis not present

## 2016-09-13 DIAGNOSIS — Z820 Family history of epilepsy and other diseases of the nervous system: Secondary | ICD-10-CM | POA: Diagnosis not present

## 2016-09-13 DIAGNOSIS — R351 Nocturia: Secondary | ICD-10-CM

## 2016-09-13 DIAGNOSIS — R6889 Other general symptoms and signs: Secondary | ICD-10-CM

## 2016-09-13 DIAGNOSIS — G478 Other sleep disorders: Secondary | ICD-10-CM

## 2016-09-13 DIAGNOSIS — G4752 REM sleep behavior disorder: Secondary | ICD-10-CM

## 2016-09-13 DIAGNOSIS — R519 Headache, unspecified: Secondary | ICD-10-CM

## 2016-09-13 DIAGNOSIS — F515 Nightmare disorder: Secondary | ICD-10-CM

## 2016-09-13 DIAGNOSIS — R51 Headache: Secondary | ICD-10-CM

## 2016-09-13 DIAGNOSIS — G4761 Periodic limb movement disorder: Secondary | ICD-10-CM

## 2016-09-13 DIAGNOSIS — R0683 Snoring: Secondary | ICD-10-CM

## 2016-09-13 DIAGNOSIS — M7989 Other specified soft tissue disorders: Secondary | ICD-10-CM

## 2016-09-13 DIAGNOSIS — G2581 Restless legs syndrome: Secondary | ICD-10-CM

## 2016-09-13 NOTE — Progress Notes (Signed)
Subjective:    Patient ID: Kelly Baird is a 76 y.o. female.  HPI     Kelly Age, MD, PhD [facility] [address], [address] [place], [place] [address]  Dear Dr. Virgina Baird,   I saw your patient, Kelly Baird, upon your kind request in my neurologic clinic today for initial consultation of her sleep disorder, in particular, concern for underlying obstructive sleep apnea. The patient is unaccompanied today. As you know, Kelly Baird is a 75 year old right-handed woman with an underlying medical history of anxiety, depression, hypertension, type 2 diabetes, history of thymic cyst with status post resection, who reports snoring and excessive daytime somnolence. I reviewed your office note from 08/13/2016, which you kindly included. Of note, her sister, who is also slender, has OSA and uses a CPAP machine. Her Epworth sleepiness score is 8 out of 24 today, her fatigue score is 43 out of 63. She lives with her husband. She is retired. She has 2 grown children, 1 son, 1 daughter, 2 grandchildren from her son. She is a nonsmoker, takes alcohol infrequently and caffeine in the form of coffee, 3-4 cups per day, as late as 9 or 10 PM. Bedtime is on the late side, between midnight and 1 AM typically, wakeup time generally between 8 and 9. She has vivid dreams and also nightmares, also some dream enactment reported, as in moving in sleep and yelling out, talks in her sleep at times. Snoring can be loud per family. She goes to the bathroom once or twice per average night. She has woken up in the middle of the night with a headache and needed to take Advil. She is a restless sleeper. She has had intermittent breasts leg symptoms with the need to move her legs and has been told that she moves her feet and legs while asleep. Her husband tends to be a very deep sleeper. She has had lower extremity swelling, left more than right and usually wears compression stockings.  Her Past  Medical History Is Significant For: Past Medical History:  Diagnosis Date  . Anxiety and depression   . Diabetes mellitus    type 1,pump  . Mood disorder ([place])   . Osteoarthritis   . Osteopenia   . Thymic cyst [facility])     Her Past Surgical History Is Significant For: Past Surgical History:  Procedure Laterality Date  . GANGLION CYST EXCISION    . partial median sternotomy thymectomy  07/04/09  . TOTAL ABDOMINAL HYSTERECTOMY      Her Family History Is Significant For: Family History  Problem Relation Baird of Onset  . Heart disease Unknown   . CAD Mother   . CAD Father   . Stroke Father   . Cancer Sister   . Sleep apnea Sister     Her Social History Is Significant For: Social History   Social History  . Marital status: Married    Spouse name: N/A  . Number of children: 2  . Years of education: Masters    Occupational History  . Retired     Social History Main Topics  . Smoking status: Never Smoker  . Smokeless tobacco: Never Used  . Alcohol use Yes     Comment: not on a reg basis, rare  . Drug use: No  . Sexual activity: Not Asked   Other Topics Concern  . None   Social History Narrative   3-4 caffeine drinks a day     Her Allergies  Are:  No Known Allergies:   Her Current Medications Are:  Outpatient Encounter Prescriptions as of 09/13/2016  Medication Sig  . alendronate (FOSAMAX) 70 MG tablet   . aspirin 325 MG EC tablet Take 325 mg by mouth daily.    Marland Kitchen b complex vitamins tablet Take 1 tablet by mouth daily.    Marland Kitchen buPROPion (WELLBUTRIN XL) 300 MG 24 hr tablet Take 300 mg by mouth daily.    . busPIRone (BUSPAR) 10 MG tablet Take by mouth.  . calcium carbonate (CALCIUM 600) 600 MG TABS tablet Take by mouth.  . ciclopirox (LOPROX) 0.77 % cream APP TO TOENAILS AND RASH ON FEET BID  . FLUoxetine (PROZAC) 10 MG capsule Take 10 mg by mouth daily. 30 mg total per day   . fluticasone (VERAMYST) 27.5 MCG/SPRAY nasal spray Place 2 sprays into the nose daily.  .  hydrochlorothiazide (HYDRODIURIL) 25 MG tablet Take 25 mg by mouth daily.    . insulin lispro (HUMALOG) 100 UNIT/ML injection INJ VIA INSULIN PUMP UP TO 70 UNITS MAXIMUM D  . LUTEIN PO Take by mouth.  . Multiple Vitamin (MULTIVITAMIN) capsule Take 1 capsule by mouth daily.    . [DISCONTINUED] busPIRone (BUSPAR) 10 MG tablet Take 10 mg by mouth 2 (two) times daily before a meal.   . [DISCONTINUED] calcium carbonate (OS-CAL) 600 MG TABS Take 600 mg by mouth 2 (two) times daily with a meal.    . [DISCONTINUED] insulin aspart (NOVOLOG) 100 UNIT/ML injection Inject into the skin 3 (three) times daily before meals.   . [DISCONTINUED] magnesium gluconate (MAGONATE) 500 MG tablet Take 250 mg by mouth 1 day or 1 dose.    . [DISCONTINUED] zinc gluconate 50 MG tablet Take 50 mg by mouth daily.     No facility-administered encounter medications on file as of 09/13/2016.   :  Review of Systems:  Out of a complete 14 point review of systems, all are reviewed and negative with the exception of these symptoms as listed below: Review of Systems  Neurological:       Has some trouble falling asleep, snores, wakes up feeling tired, headaches during the night, daytime fatigue, denies taking naps.    Epworth Sleepiness Scale 0= would never doze 1= slight chance of dozing 2= moderate chance of dozing 3= high chance of dozing  Sitting and reading:2 Watching TV:0 Sitting inactive in a public place (ex. Theater or meeting):0 As a passenger in a car for an hour without a break:1 Lying down to rest in the afternoon:2 Sitting and talking to someone:1 Sitting quietly after lunch (no alcohol):2 In a car, while stopped in traffic:0 Total:8  Objective:  Neurologic Exam  Physical Exam Physical Examination:   Vitals:   09/13/16 0911  BP: 124/60  Pulse: 70  Resp: 14    General Examination: The patient is a very pleasant 76 y.o. female in no acute distress. She appears well-developed and well-nourished and  well groomed.   HEENT: Normocephalic, atraumatic, pupils are equal, round and reactive to light and accommodation. Funduscopic exam is normal with sharp disc margins noted. Extraocular tracking is good without limitation to gaze excursion or nystagmus noted. Normal smooth pursuit is noted. Hearing is grossly intact. Tympanic membranes are clear bilaterally. Face is symmetric with normal facial animation and normal facial sensation. Speech is clear with no dysarthria noted. There is no hypophonia. There is no lip, neck/head, jaw or voice tremor. Neck is supple with full range of passive and active motion.  There are no carotid bruits on auscultation. Oropharynx exam reveals: moderate mouth dryness, adequate dental hygiene and moderate airway crowding, due to smaller airway entry and thicker/larger appearing uvula, redundant soft palate, tonsils are absent, Mallampati is class II. Neck circumference is 13-5/8 inches. Tongue protrudes centrally and palate elevates symmetrically. She has a Mild overbite.   Chest: Clear to auscultation without wheezing, rhonchi or crackles noted.  Heart: S1+S2+0, regular and normal with a 2/6 systolic murmurs (known to pt).   Abdomen: Soft, non-tender and non-distended with normal bowel sounds appreciated on auscultation. Insulin pump.  Extremities: There is 1+ pitting edema in the distal lower extremities bilaterally. She is wearing knee high compression socks b/l.  Skin: Warm and dry without trophic changes noted.  Musculoskeletal: exam reveals no obvious joint deformities, tenderness or joint swelling or erythema.   Neurologically:  Mental status: The patient is awake, alert and oriented in all 4 spheres. Her immediate and remote memory, attention, language skills and fund of knowledge are appropriate. There is no evidence of aphasia, agnosia, apraxia or anomia. Speech is clear with normal prosody and enunciation. Thought process is linear. Mood is normal and affect is  normal.  Cranial nerves II - XII are as described above under HEENT exam. In addition: shoulder shrug is normal with equal shoulder height noted. Motor exam: Normal bulk, strength and tone is noted. There is no drift, tremor or rebound. Romberg is negative. Reflexes are 1+ throughout, absent in the ankle. Fine motor skills and coordination: intact with normal finger taps, normal hand movements, normal rapid alternating patting, normal foot taps and normal foot agility.  Cerebellar testing: No dysmetria or intention tremor on finger to nose testing. Heel to shin is unremarkable bilaterally. There is no truncal or gait ataxia.  Sensory exam: intact to light touch in the upper and lower extremities.  Gait, station and balance: She stands easily. No veering to one side is noted. No leaning to one side is noted. Posture is Baird-appropriate and stance is narrow based. Gait shows normal stride length and normal pace. No problems turning are noted. Tandem walk is unremarkable.  Assessment and Plan:  In summary, LASHARA UREY is a very pleasant 76 y.o.-year old female with an underlying medical history of anxiety, depression, hypertension, type 2 diabetes, history of thymic cyst with status post resection, whose history and physical exam are concerning for obstructive sleep apnea (OSA). She has had nocturnal HAs and nocturia.  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 She has vivid dreams, she has had some dream enactments. She has had sleep talking. Attended sleep study testing is indicated. I had a long chat with the patient about my findings and the diagnosis of OSA, its prognosis and treatment options. We talked about medical treatments, surgical interventions and non-pharmacological approaches. I explained in particular the risks and ramifications of untreated moderate to severe OSA, especially with respect to developing cardiovascular disease  down the Road, including congestive heart failure, difficult to treat hypertension, cardiac arrhythmias, or stroke. Even type 2 diabetes has, in part, been linked to untreated OSA. Symptoms of untreated OSA include daytime sleepiness, memory problems, mood irritability and mood disorder such as depression and anxiety, lack of energy, as well as recurrent headaches, especially morning headaches. We talked about trying to maintain a healthy lifestyle in general, as well as the importance of weight control. I encouraged the patient to eat healthy, exercise daily and keep  well hydrated, to keep a scheduled bedtime and wake time routine, to not skip any meals and eat healthy snacks in between meals. I advised the patient not to drive when feeling sleepy. I recommended the following at this time: sleep study with potential positive airway pressure titration. (We will score hypopneas at 4%).   I explained the sleep test procedure to the patient and also outlined possible surgical and non-surgical treatment options of OSA, including the use of a custom-made dental device (which would require a referral to a specialist dentist or oral surgeon), upper airway surgical options, such as pillar implants, radiofrequency surgery, tongue base surgery, and UPPP (which would involve a referral to an ENT surgeon). Rarely, jaw surgery such as mandibular advancement may be considered.  I also explained the CPAP treatment option to the patient, who indicated that she would be willing to try CPAP if the need arises. I explained the importance of being compliant with PAP treatment, not only for insurance purposes but primarily to improve Her symptoms, and for the patient's long term health benefit, including to reduce Her cardiovascular risks. I answered all her questions today and the patient was in agreement. I would like to see her back after the sleep study is completed and encouraged her to call with any interim questions,  concerns, problems or updates.   Thank you very much for allowing me to participate in the care of this nice patient. If I can be of any further assistance to you please do not hesitate to call me at [phone].  Sincerely,   Kelly Age, MD, PhD

## 2016-09-13 NOTE — Patient Instructions (Signed)

## 2016-09-25 ENCOUNTER — Ambulatory Visit (INDEPENDENT_AMBULATORY_CARE_PROVIDER_SITE_OTHER): Payer: Medicare Other | Admitting: Neurology

## 2016-09-25 DIAGNOSIS — G4761 Periodic limb movement disorder: Secondary | ICD-10-CM

## 2016-09-25 DIAGNOSIS — G4720 Circadian rhythm sleep disorder, unspecified type: Secondary | ICD-10-CM

## 2016-09-25 DIAGNOSIS — G4733 Obstructive sleep apnea (adult) (pediatric): Secondary | ICD-10-CM

## 2016-10-01 NOTE — Procedures (Signed)
PATIENT'S NAME:  Kelly Baird, Kelly Baird DOB:      [birth year]      MR#:    [id number]     DATE OF RECORDING: 09/25/2016 REFERRING M.D.:  Shon Baton MD Study Performed:   Baseline Polysomnogram HISTORY: 76 year old woman with a history of anxiety, depression, hypertension, type 2 diabetes, history of thymic cyst with status post resection, who reports snoring and excessive daytime somnolence. The patient endorsed the Epworth Sleepiness Scale at 8/24 points. The patient's weight 142 pounds with a height of 68 (inches), resulting in a BMI of 21.4 kg/m2. The patient's neck circumference measured 13.5 inches.  CURRENT MEDICATIONS: Fosamax, Aspirin, Vitamin b, Wellbutrin, Buspar, Calcium, Loprox, Prozac, Veramyst, Hydrodiuril, Humalog, Lutein, Multivitamin.   PROCEDURE:  This is a multichannel digital polysomnogram utilizing the Somnostar 11.2 system.  Electrodes and sensors were applied and monitored per AASM Specifications.   EEG, EOG, Chin and Limb EMG, were sampled at 200 Hz.  ECG, Snore and Nasal Pressure, Thermal Airflow, Respiratory Effort, CPAP Flow and Pressure, Oximetry was sampled at 50 Hz. Digital video and audio were recorded.      BASELINE STUDY  Lights Out was at 22:16 and Lights On at 05:23.  Total recording time (TRT) was 428 minutes, with a total sleep time (TST) of  391.5 minutes.   The patient's sleep latency was 14.5 minutes.  REM latency was 151.5 minutes, which is mildly delayed.  The sleep efficiency was 91.5 %.     SLEEP ARCHITECTURE: WASO (Wake after sleep onset) was 21.5 minutes with mild sleep fragmentation noted.  There were 17 minutes in Stage N1, 195.5 minutes Stage N2, 86.5 minutes Stage N3 and 92.5 minutes in Stage REM.  The percentage of Stage N1 was 4.3%, Stage N2 was 49.9%, which is normal, Stage N3 was 22.1%, which is normal to slightly high, and Stage R (REM sleep) was 23.6%, which is normal.  The arousals were noted as: 30 were spontaneous, 14 were associated with PLMs, 24  were associated with respiratory events.    Audio and video analysis did not show any abnormal or unusual movements, behaviors, phonations or vocalizations.  The patient took 1 bathroom break. Mild snoring was noted. The EKG was in keeping with normal sinus rhythm (NSR).  RESPIRATORY ANALYSIS:  There were a total of 93 respiratory events:  44 obstructive apneas, 0 central apneas and 0 mixed apneas with a total of 44 apneas and an apnea index (AI) of 6.7 /hour. There were 49 hypopneas with a hypopnea index of 7.5 /hour. The patient also had 0 respiratory event related arousals (RERAs).      The total APNEA/HYPOPNEA INDEX (AHI) was 14.3/hour and the total RESPIRATORY DISTURBANCE INDEX was 14.3 /hour.  22 events occurred in REM sleep and 64 events in NREM. The REM AHI was 14.3 /hour, versus a non-REM AHI of 14.2. The patient spent 23 minutes of total sleep time in the supine position and 369 minutes in non-supine.. The supine AHI was 91.3 versus a non-supine AHI of 9.5.  OXYGEN SATURATION & C02:  The Wake baseline 02 saturation was 98%, with the lowest being 85%. Time spent below 89% saturation equaled 11 minutes.  PERIODIC LIMB MOVEMENTS:  The patient had a total of 180 Periodic Limb Movements.  The Periodic Limb Movement (PLM) index was 27.6 and the PLM Arousal index was 2.1/hour.  Post-study, the patient indicated that sleep was the same as usual.   IMPRESSION:  1. Obstructive Sleep Apnea (OSA) 2. PLMD (periodic  limb movement disorder) 3. Dysfunctions associated with sleep stages or arousal from sleep  RECOMMENDATIONS:  1. This study demonstrates overall mild to near-moderate obstructive sleep apnea, severe in supine sleep with a total AHI of 14.3/hour, REM AHI of 14.3/hour, supine AHI of 91.3/hour and O2 nadir of 85%. Given the patient's medical history and sleep related complaints, a full-night CPAP titration study is recommended to optimize therapy. Other treatment options for OSA may  include avoidance of supine sleep position along with weight loss, upper airway or jaw surgery in selected patients or the use of an oral appliance in certain patients. ENT evaluation and/or consultation with a maxillofacial surgeon or dentist may be feasible in some instances.    2. Mild PLMs (periodic limb movements of sleep) were noted during this study with minimal arousals; clinical correlation is recommended. Medication effect from the antidepressant medication should be considered.  3. This study shows sleep fragmentation and abnormal sleep stage percentages; these are nonspecific findings and per se do not signify an intrinsic sleep disorder or a cause for the patient's sleep-related symptoms. Causes include (but are not limited to) the first night effect of the sleep study, circadian rhythm disturbances, medication effect or an underlying mood disorder or medical problem.  4. The patient should be cautioned not to drive, work at heights, or operate dangerous or heavy equipment when tired or sleepy. Review and reiteration of good sleep hygiene measures should be pursued with any patient. 5. The patient will be seen in follow-up by Dr. Rexene Alberts at [facility] for discussion of the test results and further management strategies. The referring provider will be notified of the test results.  I certify that I have reviewed the entire raw data recording prior to the issuance of this report in accordance with the Standards of Accreditation of the American Academy of Sleep Medicine (AASM)    Star Age, MD, PhD Diplomat, American Board of Psychiatry and Neurology (Neurology and Sleep Medicine)

## 2016-10-01 NOTE — Progress Notes (Signed)
Patient referred by Dr. Virgina Jock, seen by me on 09/13/16, diagnostic PSG on 09/25/16.    Please call and notify the patient that the recent sleep study did confirm the diagnosis of obstructive sleep apnea and that I recommend treatment for this in the form of CPAP. This will require a repeat sleep study for proper titration and mask fitting. Please explain to patient and arrange for a CPAP titration study. I have placed an order in the chart. Thanks, and please route to [facility] for scheduling next sleep study.  Star Age, MD, PhD [facility])

## 2016-10-01 NOTE — Addendum Note (Signed)
Addended by: Star Age on: 10/01/2016 05:37 PM   Modules accepted: Orders

## 2016-10-03 ENCOUNTER — Telehealth: Payer: Self-pay

## 2016-10-03 NOTE — Telephone Encounter (Signed)
-----   Message from Star Age, MD sent at 10/01/2016  5:37 PM EDT ----- Patient referred by Dr. Virgina Jock, seen by me on 09/13/16, diagnostic PSG on 09/25/16.    Please call and notify the patient that the recent sleep study did confirm the diagnosis of obstructive sleep apnea and that I recommend treatment for this in the form of CPAP. This will require a repeat sleep study for proper titration and mask fitting. Please explain to patient and arrange for a CPAP titration study. I have placed an order in the chart. Thanks, and please route to [facility] for scheduling next sleep study.  Star Age, MD, PhD [facility])

## 2016-10-03 NOTE — Telephone Encounter (Signed)
I called pt. I advised pt that Dr. Rexene Alberts reviewed their sleep study results and found that pt has obstructive sleep apnea. Dr. Rexene Alberts recommends that pt return for a repeat sleep study in order to properly titrate the cpap and ensure a good mask fit. Pt is agreeable to returning for a titration study. I advised pt that our sleep lab will file with pt's insurance and call pt to schedule the sleep study when we hear back from the pt's insurance regarding coverage of this sleep study. Pt verbalized understanding of results. Pt asked that I mail her a copy of her sleep study and I confirmed that the address that we have on file is correct. Pt asked me to send a copy to Dr. Virgina Jock., PCP. Pt had no questions at this time but was encouraged to call back if questions arise.

## 2016-10-23 ENCOUNTER — Ambulatory Visit (INDEPENDENT_AMBULATORY_CARE_PROVIDER_SITE_OTHER): Payer: Medicare Other | Admitting: Neurology

## 2016-10-23 DIAGNOSIS — G4761 Periodic limb movement disorder: Secondary | ICD-10-CM

## 2016-10-23 DIAGNOSIS — G4720 Circadian rhythm sleep disorder, unspecified type: Secondary | ICD-10-CM

## 2016-10-23 DIAGNOSIS — G4733 Obstructive sleep apnea (adult) (pediatric): Secondary | ICD-10-CM | POA: Diagnosis not present

## 2016-10-26 NOTE — Addendum Note (Signed)
Addended by: Star Age on: 10/26/2016 01:55 PM   Modules accepted: Orders

## 2016-10-26 NOTE — Progress Notes (Signed)
Patient referred by Dr. Virgina Jock, seen by me on 09/13/16, diagnostic PSG on 09/25/16, CPAP study on 10/23/16. Please call and inform patient that I have entered an order for treatment with positive airway pressure (PAP) treatment of obstructive sleep apnea (OSA). She did well during the latest sleep study with CPAP. We will, therefore, arrange for a machine for home use through a DME (durable medical equipment) company of Her choice; and I will see the patient back in follow-up in about 10 weeks, may need to see CM or MM if needed for timing. Please also explain to the patient that I will be looking out for compliance data, which can be downloaded from the machine (stored on an SD card, that is inserted in the machine) or via remote access through a modem, that is built into the machine. At the time of the followup appointment we will discuss sleep study results and how it is going with PAP treatment at home. Please advise patient to bring Her machine at the time of the first FU visit, even though this is cumbersome. Bringing the machine for every visit after that will likely not be needed, but often helps for the first visit to troubleshoot if needed. Please re-enforce the importance of compliance with treatment and the need for [place] to monitor compliance data - often an insurance requirement and actually good feedback for the patient as far as how they are doing.  Also remind patient, that any interim PAP machine or mask issues should be first addressed with the DME company, as they can often help better with technical and mask fit issues. Please ask if patient has a preference regarding DME company.  Please also make sure, the patient has a follow-up appointment with me or one of our NPs in about 10 weeks from the setup date, thanks.  Once you have spoken to the patient - and faxed/routed report to PCP and referring MD (if other than PCP), you can close this encounter, thanks,   Star Age, MD, PhD [facility] ([place])

## 2016-10-26 NOTE — Procedures (Signed)
PATIENT'S NAME:  Kelly Baird, Kelly Baird DOB:      05-22-1940      MR#:    [id number]     DATE OF RECORDING: 10/23/2016 REFERRING M.D.:  Shon Baton MD Study Performed:   CPAP  Titration HISTORY:  76 year old right-handed woman with an underlying medical history of anxiety, depression, hypertension, type 2 diabetes, history of thymic cyst with status post resection, who returns for a full night CPAP titration study. She had a baseline PSG on 09/25/16, which showed overall mild to near-moderate obstructive sleep apnea, severe in supine sleep with a total AHI of 14.3/hour, REM AHI of 14.3/hour, supine AHI of 91.3/hour and O2 nadir of 85%. The patient endorsed the Epworth Sleepiness Scale at 8/24 points. BMI of 21.4 kg/m2. The patient's neck circumference measured 13 inches.  CURRENT MEDICATIONS: Fosamax, Aspirin, Vitamin b, Wellbutrin, Buspar, Calcium, Loprox, Prozac, Veramyst, Hydrodiuril, Humalog, Lutein, Multivitamin.  PROCEDURE:  This is a multichannel digital polysomnogram utilizing the SomnoStar 11.2 system.  Electrodes and sensors were applied and monitored per AASM Specifications.   EEG, EOG, Chin and Limb EMG, were sampled at 200 Hz.  ECG, Snore and Nasal Pressure, Thermal Airflow, Respiratory Effort, CPAP Flow and Pressure, Oximetry was sampled at 50 Hz. Digital video and audio were recorded.      The patient was fitted with XS P10 nasal pillows. CPAP was initiated at 5 cm H20 with heated humidity per AASM standards and pressure was advanced to 11 cm H20 because of hypopneas, apneas and desaturations.  At a PAP pressure of 9 cm H20, there was a reduction of the AHI to 0.6/hour with supine REM sleep achieved and O2 nadir of 93%.    Lights Out was at 22:19 and Lights On at 05:19. Total recording time (TRT) was 420.5 minutes, with a total sleep time (TST) of 345 minutes. The patient's sleep latency was 22.5 minutes. REM latency was 255.5 minutes, which is markedly delayed. The sleep efficiency was 82. %.     SLEEP ARCHITECTURE: WASO (Wake after sleep onset)  was 62 minutes with mild to moderate sleep fragmentation noted. There were 17 minutes in Stage N1, 279 minutes Stage N2, 0 minutes Stage N3 and 49 minutes in Stage REM. The percentage of Stage N1 was 4.9%, Stage N2 was 80.9%, which is markedly increased, Stage N3 was absent and Stage R (REM sleep) was 14.2%, which is reduced.  The arousals were noted as: 32 were spontaneous, 27 were associated with PLMs, 19 were associated with respiratory events.  Audio and video analysis did not show any abnormal or unusual movements, behaviors, phonations or vocalizations.  The patient took 1 bathroom break. The EKG was in keeping with normal sinus rhythm (NSR).  RESPIRATORY ANALYSIS:  There was a total of 19 respiratory events: 0 obstructive apneas, 0 central apneas and 0 mixed apneas with a total of 0 apneas and an apnea index (AI) of 0 /hour. There were 19 hypopneas with a hypopnea index of 3.3/hour. The patient also had 0 respiratory event related arousals (RERAs).      The total APNEA/HYPOPNEA INDEX  (AHI) was 3.3 /hour and the total RESPIRATORY DISTURBANCE INDEX was 3.3 .hour  0 events occurred in REM sleep and 19 events in NREM. The REM AHI was 0 /hour versus a non-REM AHI of 3.9 /hour.  The patient spent 90 minutes of total sleep time in the supine position and 255 minutes in non-supine. The supine AHI was 0.7, versus a non-supine AHI of 4.2.  OXYGEN SATURATION & C02:  The baseline 02 saturation was 96%, with the lowest being 84%. Time spent below 89% saturation equaled 0 minutes.  PERIODIC LIMB MOVEMENTS: The patient had a total of 125 Periodic Limb Movements. The Periodic Limb Movement (PLM) index was 21.7 and the PLM Arousal index was 4.7 /hour. Post-study, the patient indicated that sleep was the same as usual.   IMPRESSION: 1. Obstructive Sleep Apnea (OSA) 2. PLMD (periodic limb movement disorder) 3. Dysfunctions associated with sleep stages or  arousal from sleep   RECOMMENDATIONS: 1.  This study demonstrates resolution of the patient's obstructive sleep apnea with CPAP therapy. I will, therefore, start the patient on home CPAP treatment at a pressure of 9 cm via XS nasal pillows with heated humidity. The patient should be reminded to be fully compliant with PAP therapy to improve sleep related symptoms and decrease long term cardiovascular risks. The patient should be reminded, that it may take up to 3 months to get fully used to using PAP with all planned sleep. The earlier full compliance is achieved, the better long term compliance tends to be. Please note that untreated obstructive sleep apnea carries additional perioperative morbidity. Patients with significant obstructive sleep apnea should receive perioperative PAP therapy and the surgeons and particularly the anesthesiologist should be informed of the diagnosis and the severity of the sleep disordered breathing. 2. Mild PLMs (periodic limb movements of sleep) were noted during this study with minimal arousals; clinical correlation is recommended. Medication effect from the antidepressant medication should be considered.  3. This study shows sleep fragmentation and abnormal sleep stage percentages; these are nonspecific findings and per se do not signify an intrinsic sleep disorder or a cause for the patient's sleep-related symptoms. Causes include (but are not limited to) the first night effect of the sleep study, circadian rhythm disturbances, medication effect or an underlying mood disorder or medical problem.  4. The patient should be cautioned not to drive, work at heights, or operate dangerous or heavy equipment when tired or sleepy. Review and reiteration of good sleep hygiene measures should be pursued with any patient. 5. The patient will be seen in follow-up by Dr. Rexene Alberts at [facility] for discussion of the test results and further management strategies. The referring provider will be  notified of the test results.   I certify that I have reviewed the entire raw data recording prior to the issuance of this report in accordance with the Standards of Accreditation of the American Academy of Sleep Medicine (AASM)   Star Age, MD, PhD Diplomat, American Board of Psychiatry and Neurology (Neurology and Sleep Medicine)

## 2016-10-29 ENCOUNTER — Telehealth: Payer: Self-pay

## 2016-10-29 NOTE — Telephone Encounter (Signed)
-----   Message from Star Age, MD sent at 10/26/2016  1:55 PM EDT ----- Patient referred by Dr. Virgina Jock, seen by me on 09/13/16, diagnostic PSG on 09/25/16, CPAP study on 10/23/16. Please call and inform patient that I have entered an order for treatment with positive airway pressure (PAP) treatment of obstructive sleep apnea (OSA). She did well during the latest sleep study with CPAP. We will, therefore, arrange for a machine for home use through a DME (durable medical equipment) company of Her choice; and I will see the patient back in follow-up in about 10 weeks, may need to see CM or MM if needed for timing. Please also explain to the patient that I will be looking out for compliance data, which can be downloaded from the machine (stored on an SD card, that is inserted in the machine) or via remote access through a modem, that is built into the machine. At the time of the followup appointment we will discuss sleep study results and how it is going with PAP treatment at home. Please advise patient to bring Her machine at the time of the first FU visit, even though this is cumbersome. Bringing the machine for every visit after that will likely not be needed, but often helps for the first visit to troubleshoot if needed. Please re-enforce the importance of compliance with treatment and the need for [place] to monitor compliance data - often an insurance requirement and actually good feedback for the patient as far as how they are doing.  Also remind patient, that any interim PAP machine or mask issues should be first addressed with the DME company, as they can often help better with technical and mask fit issues. Please ask if patient has a preference regarding DME company.  Please also make sure, the patient has a follow-up appointment with me or one of our NPs in about 10 weeks from the setup date, thanks.  Once you have spoken to the patient - and faxed/routed report to PCP and referring MD (if other than PCP), you  can close this encounter, thanks,   Star Age, MD, PhD [facility] ([place])

## 2016-10-29 NOTE — Telephone Encounter (Signed)
I called pt. I advised pt that Dr. Rexene Alberts reviewed their sleep study results and found that pt did well during the latest sleep study with cpap. Dr. Rexene Alberts recommends that pt start a cpap at home. I reviewed PAP compliance expectations with the pt. Pt is agreeable to starting a CPAP. I advised pt that an order will be sent to a DME, [facility], and [facility] will call the pt within about one week after they file with the pt's insurance. [facility] will show the pt how to use the machine, fit for masks, and troubleshoot the CPAP if needed. A follow up appt was made for insurance purposes with Dr. Rexene Alberts on Tuesday, September 25th, 2018 at 8:30am. Pt verbalized understanding to arrive 15 minutes early and bring their CPAP. A letter with all of this information in it will be mailed to the pt as a reminder. Pt asked that I mail her a copy of these sleep study results. I verified with the pt that the address we have on file is correct. Pt verbalized understanding of results. Pt had no questions at this time but was encouraged to call back if questions arise.

## 2017-01-06 ENCOUNTER — Encounter: Payer: Self-pay | Admitting: Neurology

## 2017-01-08 ENCOUNTER — Encounter: Payer: Self-pay | Admitting: Neurology

## 2017-01-08 ENCOUNTER — Ambulatory Visit (INDEPENDENT_AMBULATORY_CARE_PROVIDER_SITE_OTHER): Payer: Medicare Other | Admitting: Neurology

## 2017-01-08 VITALS — BP 138/84 | HR 64 | Ht 67.0 in | Wt 145.0 lb

## 2017-01-08 DIAGNOSIS — G4733 Obstructive sleep apnea (adult) (pediatric): Secondary | ICD-10-CM | POA: Diagnosis not present

## 2017-01-08 DIAGNOSIS — Z9989 Dependence on other enabling machines and devices: Secondary | ICD-10-CM

## 2017-01-08 NOTE — Patient Instructions (Addendum)
Please continue using your CPAP regularly. While your insurance requires that you use CPAP at least 4 hours each night on 70% of the nights, I recommend, that you not skip any nights and use it throughout the night if you can. Getting used to CPAP and staying with the treatment long term does take time and patience and discipline. Untreated obstructive sleep apnea when it is moderate to severe can have an adverse impact on cardiovascular health and raise her risk for heart disease, arrhythmias, hypertension, congestive heart failure, stroke and diabetes. Untreated obstructive sleep apnea causes sleep disruption, nonrestorative sleep, and sleep deprivation. This can have an impact on your day to day functioning and cause daytime sleepiness and impairment of cognitive function, memory loss, mood disturbance, and problems focussing. Using CPAP regularly can improve these symptoms.  Keep up the good work! I would like to see you before you move to [place]. Good luck with the move! We will see you back in 4 months for sleep apnea check up.

## 2017-01-08 NOTE — Progress Notes (Signed)
Subjective:    Patient ID: Kelly Baird is a 76 y.o. female.  HPI     Interim history:   Kelly Baird is a 76 year old right-handed woman with an underlying medical history of anxiety, depression, type 1 diabetes, history of thymic cyst with status post resection, who presents for follow-up consultation of Kelly Baird obstructive sleep apnea, after recent sleep study testing. The patient is unaccompanied today. I first met Kelly Baird on 09/13/2016 at the request of Kelly Baird primary care physician, at which time she reported snoring and daytime somnolence as well as nocturnal headaches. I suggested we proceed with sleep study testing. She had a baseline sleep study, followed by a CPAP titration study. I went over Kelly Baird test results with Kelly Baird in detail today. Baseline sleep study on 09/25/2016 showed a sleep efficiency of 91.5%, sleep latency of 14.5 minutes, wake after sleep onset of 21.5 minutes. She had normal percentage of REM sleep and high normal percentage of slow-wave sleep, total AHI of 14.3 per hour, REM AHI of 14.3 per hour, supine AHI 91.3 per hour. Average oxygen saturation 90%, nadir was 85%, she had moderate PLMS with minimal arousals. Based on Kelly Baird sleep-related complaints and Kelly Baird test results as well as medical history I suggested we proceed with a full night CPAP titration study. She had this on 10/23/2016: Sleep efficiency was 82%, sleep latency 22.5 minutes, REM latency markedly delayed at 255.5 minutes. She was fitted with nasal pillows and CPAP was titrated from 5 cm to 11 cm. On a pressure of 9 cm Kelly Baird AHI was 0.6 per hour with supine REM sleep achieved an O2 nadir of 93%. She had mild PLMS with minimal arousals. Average oxygen saturation of 96%, nadir overall of 84%. Based on Kelly Baird test results I prescribed CPAP therapy for home use at a pressure of 9 cm.  Today, 01/08/2017 (all dictated new, as well as above notes, some dictation done in note pad or Word, outside of chart, may appear as copied):  I reviewed  Kelly Baird CPAP compliance data from 12/08/2016 through 01/06/2017 which is a total of 30 days, during which time she used Kelly Baird CPAP every night with percent used days greater than 4 hours at 100%, indicating superb compliance with an average usage of 6 hours and 50 minutes, residual AHI borderline at 4.9 per hour, leak on the high side with the 95th percentile at 29.6 L/m on a pressure of 9 cm with EPR of 3. She reports feeling improved, in that Kelly Baird sleep seems to be better quality, more restful, feels more refreshed in AM, about the same for nocturia and occasional milder HA. Using med nasal pillows currently, may have air leak from mouth. She and Kelly Baird husband will be moving to [facility] to be closer to Kelly Baird daughter. Kelly Baird son lives in [place]. They are in the process of getting Kelly Baird home ready for sale and may be moving in the next 4 months or so.  patient's allergies, current medications, family history, past medical history, past social history, past surgical history and problem list were reviewed and updated as appropriate.   Previously (copied from previous notes for reference):   09/13/16: (She) reports snoring and excessive daytime somnolence. I reviewed your office note from 08/13/2016, which you kindly included. Of note, Kelly Baird sister, who is also slender, has OSA and uses a CPAP machine. Kelly Baird Epworth sleepiness score is 8 out of 24 today, Kelly Baird fatigue score is 43 out of 63. She lives with Kelly Baird husband. She is  retired. She has 2 grown children, 1 son, 1 daughter, 2 grandchildren from Kelly Baird son. She is a nonsmoker, takes alcohol infrequently and caffeine in the form of coffee, 3-4 cups per day, as late as 9 or 10 PM. Bedtime is on the late side, between midnight and 1 AM typically, wakeup time generally between 8 and 9. She has vivid dreams and also nightmares, also some dream enactment reported, as in moving in sleep and yelling out, talks in Kelly Baird sleep at times. Snoring can be loud per family. She goes to  the bathroom once or twice per average night. She has woken up in the middle of the night with a headache and needed to take Advil. She is a restless sleeper. She has had intermittent breasts leg symptoms with the need to move Kelly Baird legs and has been told that she moves Kelly Baird feet and legs while asleep. Kelly Baird husband tends to be a very deep sleeper. She has had lower extremity swelling, left more than right and usually wears compression stockings.  Kelly Baird Past Medical History Is Significant For: Past Medical History:  Diagnosis Date  . Anxiety and depression   . Diabetes mellitus    type 1,pump  . Mood disorder ([place])   . Osteoarthritis   . Osteopenia   . Thymic cyst [facility])     Kelly Baird Past Surgical History Is Significant For: Past Surgical History:  Procedure Laterality Date  . GANGLION CYST EXCISION    . partial median sternotomy thymectomy  07/04/09  . TOTAL ABDOMINAL HYSTERECTOMY      Kelly Baird Family History Is Significant For: Family History  Problem Relation Age of Onset  . Heart disease Unknown   . CAD Mother   . CAD Father   . Stroke Father   . Cancer Sister   . Sleep apnea Sister     Kelly Baird Social History Is Significant For: Social History   Social History  . Marital status: Married    Spouse name: N/A  . Number of children: 2  . Years of education: Masters    Occupational History  . Retired     Social History Main Topics  . Smoking status: Never Smoker  . Smokeless tobacco: Never Used  . Alcohol use Yes     Comment: not on a reg basis, rare  . Drug use: No  . Sexual activity: Not Asked   Other Topics Concern  . None   Social History Narrative   3-4 caffeine drinks a day     Kelly Baird Allergies Are:  No Known Allergies:   Kelly Baird Current Medications Are:  Outpatient Encounter Prescriptions as of 01/08/2017  Medication Sig  . alendronate (FOSAMAX) 70 MG tablet   . aspirin 325 MG EC tablet Take 325 mg by mouth daily.    Marland Kitchen b complex vitamins tablet Take 1 tablet by mouth  daily.    Marland Kitchen buPROPion (WELLBUTRIN XL) 300 MG 24 hr tablet Take 300 mg by mouth daily.    . busPIRone (BUSPAR) 10 MG tablet Take by mouth.  . calcium carbonate (CALCIUM 600) 600 MG TABS tablet Take by mouth.  . ciclopirox (LOPROX) 0.77 % cream APP TO TOENAILS AND RASH ON FEET BID  . FLUoxetine (PROZAC) 10 MG capsule Take 10 mg by mouth daily. 30 mg total per day   . fluticasone (VERAMYST) 27.5 MCG/SPRAY nasal spray Place 2 sprays into the nose daily.  . hydrochlorothiazide (HYDRODIURIL) 25 MG tablet Take 25 mg by mouth daily.    . insulin  lispro (HUMALOG) 100 UNIT/ML injection INJ VIA INSULIN PUMP UP TO 70 UNITS MAXIMUM D  . LUTEIN PO Take by mouth.  . Multiple Vitamin (MULTIVITAMIN) capsule Take 1 capsule by mouth daily.     No facility-administered encounter medications on file as of 01/08/2017.   :  Review of Systems:  Out of a complete 14 point review of systems, all are reviewed and negative with the exception of these symptoms as listed below: Review of Systems  Neurological:       Pt presents today to discuss Kelly Baird cpap. Pt feels better after using Kelly Baird cpap.    Objective:  Neurological Exam  Physical Exam Physical Examination:   Vitals:   01/08/17 0830  BP: 138/84  Pulse: 64    General Examination: The patient is a very pleasant 76 y.o. female in no acute distress. She appears well-developed and well-nourished and well groomed. Good spirits.   HEENT: Normocephalic, atraumatic, pupils are equal, round and reactive to light and accommodation. Extraocular tracking is good without limitation to gaze excursion or nystagmus noted.  corrective eyeglasses in place. Normal smooth pursuit is noted. Hearing is grossly intact. Face is symmetric with normal facial animation and normal facial sensation. Speech is clear with no dysarthria noted. There is no hypophonia. There is no lip, neck/head, jaw or voice tremor. Neck is supple with full range of passive and active motion. There are no  carotid bruits on auscultation. Oropharynx exam reveals: moderate mouth dryness, adequate dental hygiene and moderate airway crowding, no tonsils. Mallampati is class II.   Chest: Clear to auscultation without wheezing, rhonchi or crackles noted.  Heart: S1+S2+0, regular and normal with a 2/6 systolic murmurs (known to pt).   Abdomen: Soft, non-tender and non-distended with normal bowel sounds appreciated on auscultation. Insulin pump.  Extremities: There is 1+ pitting edema in the distal lower extremities bilaterally. She is wearing knee high compression socks b/l.  Skin: Warm and dry without trophic changes noted.  Musculoskeletal: exam reveals no obvious joint deformities, tenderness or joint swelling or erythema.   Neurologically:  Mental status: The patient is awake, alert and oriented in all 4 spheres. Kelly Baird immediate and remote memory, attention, language skills and fund of knowledge are appropriate. There is no evidence of aphasia, agnosia, apraxia or anomia. Speech is clear with normal prosody and enunciation. Thought process is linear. Mood is normal and affect is normal.  Cranial nerves II - XII are as described above under HEENT exam. In addition: shoulder shrug is normal with equal shoulder height noted. Motor exam: Normal bulk, strength and tone is noted. There is no drift, tremor or rebound. Romberg is negative. Reflexes are 1+ throughout, absent in the ankles. Fine motor skills and coordination: grossly intact with normal finger taps, normal hand movements, normal rapid alternating patting, normal foot taps and normal foot agility.  Cerebellar testing: No dysmetria or intention tremor on finger to nose testing. Heel to shin is unremarkable bilaterally. There is no truncal or gait ataxia.  Sensory exam: intact to light touch in the upper and lower extremities.  Gait, station and balance: She stands easily. No veering to one side is noted. No leaning to one side is noted.  Posture is age-appropriate and stance is narrow based. Gait shows normal stride length and normal pace. No problems turning are noted.   Assessment and Plan:  In summary, JOETTE SCHMOKER is a very pleasant 76 year old female with an underlying medical history of anxiety, depression, diabetes, history of  thymic cyst with status post resection, who presents for follow-up consultation of Kelly Baird obstructive sleep apnea. She had a baseline sleep study in June 2018, followed by a CPAP titration study in July 2018. She has established treatment with CPAP with full compliance and good results reported. She is commended for Kelly Baird treatment adherence and encouraged to continue with CPAP therapy. We will try a chinstrap to see if the leak improves, we may have to change to a different type of nasal interface next. She is willing to continue with treatment. She may be moving in the next 3-4 months to [place], [place]. We will try to fit and a follow-up appointment in between so she can get supplies at a time and does not feel under pressure to establish care with a sleep specialist. She will eventually need to see a sleep specialist in [place]. Physical exam is stable. We talked about Kelly Baird sleep study results and also reviewed Kelly Baird compliance data in detail today. I suggested a four-month recheck, sooner if she finds out that she is leaving sooner. She will call. I answered all Kelly Baird questions today and the patient was in agreement. I spent 30 minutes in total face-to-face time with the patient, more than 50% of which was spent in counseling and coordination of care, reviewing test results, reviewing medication and discussing or reviewing the diagnosis of OSA, its prognosis and treatment options. Pertinent laboratory and imaging test results that were available during this visit with the patient were reviewed by me and considered in my medical decision making (see chart for details).

## 2017-04-17 ENCOUNTER — Encounter: Payer: Self-pay | Admitting: Neurology

## 2017-04-22 ENCOUNTER — Encounter: Payer: Self-pay | Admitting: Neurology

## 2017-04-22 ENCOUNTER — Encounter (INDEPENDENT_AMBULATORY_CARE_PROVIDER_SITE_OTHER): Payer: Self-pay

## 2017-04-22 ENCOUNTER — Ambulatory Visit: Payer: Medicare Other | Admitting: Neurology

## 2017-04-22 VITALS — BP 148/78 | HR 58 | Ht 68.0 in | Wt 147.0 lb

## 2017-04-22 DIAGNOSIS — Z9989 Dependence on other enabling machines and devices: Secondary | ICD-10-CM | POA: Diagnosis not present

## 2017-04-22 DIAGNOSIS — G4733 Obstructive sleep apnea (adult) (pediatric): Secondary | ICD-10-CM | POA: Diagnosis not present

## 2017-04-22 NOTE — Patient Instructions (Signed)
Please continue using your CPAP regularly. While your insurance requires that you use CPAP at least 4 hours each night on 70% of the nights, I recommend, that you not skip any nights and use it throughout the night if you can. Getting used to CPAP and staying with the treatment long term does take time and patience and discipline. Untreated obstructive sleep apnea when it is moderate to severe can have an adverse impact on cardiovascular health and raise her risk for heart disease, arrhythmias, hypertension, congestive heart failure, stroke and diabetes. Untreated obstructive sleep apnea causes sleep disruption, nonrestorative sleep, and sleep deprivation. This can have an impact on your day to day functioning and cause daytime sleepiness and impairment of cognitive function, memory loss, mood disturbance, and problems focussing. Using CPAP regularly can improve these symptoms. I wish you all the best for your move.  Keep up the good work with your CPAP! You are fully compliant.

## 2017-04-22 NOTE — Progress Notes (Signed)
Subjective:    Patient ID: Kelly Baird is a 77 y.o. female.  HPI     Interim history:   Kelly Baird is a 77 year old right-handed woman with an underlying medical history of anxiety, depression, type 1 diabetes, history of thymic cyst with status post resection, who presents for follow-up consultation of her obstructive sleep apnea, established on CPAP therapy. The patient is unaccompanied today. I last saw her on 01/08/2017, at which time we talked about her sleep study results and reviewed her compliance with CPAP. She indicated good results with CPAP and was fully compliant with treatment. She reported improvement in her sleep quality and daytime tiredness as well as nocturia and morning headaches.  Today, 04/22/2017: I reviewed her CPAP compliance data from 03/19/2017 through 04/17/2017 which is a total of 30 days, during which time she used her machine every night with percent used days greater than 4 hours at 100%, indicating superb compliance with an average usage of 7 hours and 8 minutes which is also very good, residual AHI at goal at 3 per hour, leak on the higher end of acceptable with the 95th percentile at 17.1 L/m on a pressure of 9 cm with EPR of 3. She reports doing well with her CPAP. They will be moving to [facility] next week. She has been able to find a condo in [place] and they are able to hopefully close in their home here, they have partially moved furniture already. Understandably, this has been on intense experience for her in the past weeks and months. Nevertheless, she is able to hold up okay what with the stress of the move. Thankfully, her daughter and her son-in-law helping quite a bit.   The patient's allergies, current medications, family history, past medical history, past social history, past surgical history and problem list were reviewed and updated as appropriate.    Previously (copied from previous notes for reference):    I first met her on 09/13/2016  at the request of her primary care physician, at which time she reported snoring and daytime somnolence as well as nocturnal headaches. I suggested we proceed with sleep study testing. She had a baseline sleep study, followed by a CPAP titration study. I went over her test results with her in detail today. Baseline sleep study on 09/25/2016 showed a sleep efficiency of 91.5%, sleep latency of 14.5 minutes, wake after sleep onset of 21.5 minutes. She had normal percentage of REM sleep and high normal percentage of slow-wave sleep, total AHI of 14.3 per hour, REM AHI of 14.3 per hour, supine AHI 91.3 per hour. Average oxygen saturation 90%, nadir was 85%, she had moderate PLMS with minimal arousals. Based on her sleep-related complaints and her test results as well as medical history I suggested we proceed with a full night CPAP titration study. She had this on 10/23/2016: Sleep efficiency was 82%, sleep latency 22.5 minutes, REM latency markedly delayed at 255.5 minutes. She was fitted with nasal pillows and CPAP was titrated from 5 cm to 11 cm. On a pressure of 9 cm her AHI was 0.6 per hour with supine REM sleep achieved an O2 nadir of 93%. She had mild PLMS with minimal arousals. Average oxygen saturation of 96%, nadir overall of 84%. Based on her test results I prescribed CPAP therapy for home use at a pressure of 9 cm.   I reviewed her CPAP compliance data from 12/08/2016 through 01/06/2017 which is a total of 30 days, during which time she  used her CPAP every night with percent used days greater than 4 hours at 100%, indicating superb compliance with an average usage of 6 hours and 50 minutes, residual AHI borderline at 4.9 per hour, leak on the high side with the 95th percentile at 29.6 L/m on a pressure of 9 cm with EPR of 3.  09/13/16: (She) reports snoring and excessive daytime somnolence. I reviewed your office note from 08/13/2016, which you kindly included. Of note, her sister, who is also slender,  has OSA and uses a CPAP machine. Her Epworth sleepiness score is 8 out of 24 today, her fatigue score is 43 out of 63. She lives with her husband. She is retired. She has 2 grown children, 1 son, 1 daughter, 2 grandchildren from her son. She is a nonsmoker, takes alcohol infrequently and caffeine in the form of coffee, 3-4 cups per day, as late as 9 or 10 PM. Bedtime is on the late side, between midnight and 1 AM typically, wakeup time generally between 8 and 9. She has vivid dreams and also nightmares, also some dream enactment reported, as in moving in sleep and yelling out, talks in her sleep at times. Snoring can be loud per family. She goes to the bathroom once or twice per average night. She has woken up in the middle of the night with a headache and needed to take Advil. She is a restless sleeper. She has had intermittent breasts leg symptoms with the need to move her legs and has been told that she moves her feet and legs while asleep. Her husband tends to be a very deep sleeper. She has had lower extremity swelling, left more than right and usually wears compression stockings.   Past Medical History Is Significant For: Past Medical History:  Diagnosis Date  . Anxiety and depression   . Diabetes mellitus    type 1,pump  . Mood disorder ([place])   . Osteoarthritis   . Osteopenia   . Thymic cyst [facility])     Her Past Surgical History Is Significant For: Past Surgical History:  Procedure Laterality Date  . GANGLION CYST EXCISION    . partial median sternotomy thymectomy  07/04/09  . TOTAL ABDOMINAL HYSTERECTOMY      Her Family History Is Significant For: Family History  Problem Relation Age of Onset  . Heart disease Unknown   . CAD Mother   . CAD Father   . Stroke Father   . Cancer Sister   . Sleep apnea Sister     Her Social History Is Significant For: Social History   Socioeconomic History  . Marital status: Married    Spouse name: None  . Number of children: 2  . Years of  education: Masters   . Highest education level: None  Social Needs  . Financial resource strain: None  . Food insecurity - worry: None  . Food insecurity - inability: None  . Transportation needs - medical: None  . Transportation needs - non-medical: None  Occupational History  . Occupation: Retired   Tobacco Use  . Smoking status: Never Smoker  . Smokeless tobacco: Never Used  Substance and Sexual Activity  . Alcohol use: Yes    Comment: not on a reg basis, rare  . Drug use: No  . Sexual activity: None  Other Topics Concern  . None  Social History Narrative   3-4 caffeine drinks a day     Her Allergies Are:  No Known Allergies:   Her Current  Medications Are:  Outpatient Encounter Medications as of 04/22/2017  Medication Sig  . alendronate (FOSAMAX) 70 MG tablet   . aspirin 325 MG EC tablet Take 325 mg by mouth daily.    Marland Kitchen b complex vitamins tablet Take 1 tablet by mouth daily.    Marland Kitchen buPROPion (WELLBUTRIN XL) 300 MG 24 hr tablet Take 300 mg by mouth daily.    . busPIRone (BUSPAR) 10 MG tablet Take by mouth.  . calcium carbonate (CALCIUM 600) 600 MG TABS tablet Take by mouth.  . ciclopirox (LOPROX) 0.77 % cream APP TO TOENAILS AND RASH ON FEET BID  . FLUoxetine (PROZAC) 10 MG capsule Take 10 mg by mouth daily. 30 mg total per day   . fluticasone (VERAMYST) 27.5 MCG/SPRAY nasal spray Place 2 sprays into the nose daily.  . hydrochlorothiazide (HYDRODIURIL) 25 MG tablet Take 25 mg by mouth daily.    . insulin lispro (HUMALOG) 100 UNIT/ML injection INJ VIA INSULIN PUMP UP TO 70 UNITS MAXIMUM D  . LUTEIN PO Take by mouth.  . Multiple Vitamin (MULTIVITAMIN) capsule Take 1 capsule by mouth daily.     No facility-administered encounter medications on file as of 04/22/2017.   :  Review of Systems:  Out of a complete 14 point review of systems, all are reviewed and negative with the exception of these symptoms as listed below: Review of Systems  Neurological:       Pt presents  today to discuss her cpap. Pt feels that her cpap is going well. Pt uses [employer].    Objective:  Neurological Exam  Physical Exam Physical Examination:   Vitals:   04/22/17 1256  BP: (!) 148/78  Pulse: (!) 58   General Examination: The patient is a very pleasant 77 y.o. female in no acute distress. She appears well-developed and well-nourished and well groomed.   HEENT:Normocephalic, atraumatic, pupils are equal, round and reactive to light and accommodation. Extraocular tracking is good without limitation to gaze excursion or nystagmus noted. she has corrective eyeglasses in place. Normal smooth pursuit is noted. Hearing is grossly intact. Face is symmetric with normal facial animation and normal facial sensation. Speech is clear with no dysarthria noted. There is no hypophonia. There is no lip, neck/head, jaw or voice tremor. Neck is supple with full range of passive and active motion. There are no carotid bruits on auscultation. Oropharynx exam reveals: no obvious change, no sores at the nostrils, but has had intermittent redness from the nasal pillows.   Chest:Clear to auscultation without wheezing, rhonchi or crackles noted.  Heart:S1+S2+0, regular and normal with a 2/6 systolic murmurs (known to pt).   Abdomen:Soft, non-tender and non-distended with normal bowel sounds appreciated on auscultation. Insulin pump.  Extremities:There is trace to 1+pitting edema in the distal lower extremities bilaterally, L.R. She is wearing knee high compression socks b/l.  Skin: Warm and dry without trophic changes noted.  Musculoskeletal: exam reveals no obvious joint deformities, tenderness or joint swelling or erythema.   Neurologically:  Mental status: The patient is awake, alert and oriented in all 4 spheres. Herimmediate and remote memory, attention, language skills and fund of knowledge are appropriate. There is no evidence of aphasia, agnosia, apraxia or anomia. Speech is  clear with normal prosody and enunciation. Thought process is linear. Mood is normaland affect is normal.  Cranial nerves II - XII are as described above under HEENT exam. In addition: shoulder shrug is normal with equal shoulder height noted. Motor exam: Normal bulk, strength and  tone is noted. There is no drift, tremor or rebound. Fine motor skills and coordination: grossly intact.  Cerebellar testing: No dysmetria or intention tremor. There is no truncal or gait ataxia.  Sensory exam: intact to light touch in the upper and lower extremities.  Gait, station and balance: Shestands easily. No veering to one side is noted. No leaning to one side is noted. Posture is age-appropriate and stance is narrow based. Gait shows normalstride length and normalpace. No problems turning are noted.   Assessment and Plan:  In summary, ASIANAE MINKLER a very pleasant 77 year old female with an underlying medical history of anxiety, depression, diabetes, history of thymic cyst with status post resection, who presents for follow-up consultation of her obstructive sleep apnea. She is well established on CPAP therapy at this time. She had a baseline sleep study in June 2018, followed by a CPAP titration study in July 2018. She continues to be fully compliant with CPAP and has ongoing good results. She is commended for her treatment adherence and encouraged to continue with CPAP therapy. After she started using a chinstrap after our visit in September 2018, her leak has indeed improved.  She and her husband are moving to [place], [place]. She is advised to call her DME company to be able to fill all her supplies before she moves so she has enough filters and supplies after she moves. She may be able to get her supplies from her primary care physician as she is fully compliant and well treated with the current settings. She may eventually need to see a sleep specialist in [place]. Physical exam is stable. At this point,  we did not plan a FU appointment, as she is moving.  I answered all her questions today and she was in agreement.  I spent 15 minutes in total face-to-face time with the patient, more than 50% of which was spent in counseling and coordination of care, reviewing test results, reviewing medication and discussing or reviewing the diagnosis of OSA, its prognosis and treatment options. Pertinent laboratory and imaging test results that were available during this visit with the patient were reviewed by me and considered in my medical decision making (see chart for details).

## 2017-05-14 ENCOUNTER — Ambulatory Visit: Payer: Medicare Other | Admitting: Neurology
